# Patient Record
Sex: Female | Born: 1975 | Hispanic: Yes | Marital: Married | Smoking: Never smoker
Health system: Southern US, Community
[De-identification: ages and names within clinical notes are randomized; demographics above are authoritative.]

## PROBLEM LIST (undated history)

## (undated) DIAGNOSIS — Z789 Other specified health status: Secondary | ICD-10-CM

## (undated) HISTORY — DX: Other specified health status: Z78.9

---

## 2005-07-04 ENCOUNTER — Ambulatory Visit: Payer: Self-pay | Admitting: Family Medicine

## 2005-07-06 ENCOUNTER — Observation Stay: Payer: Self-pay | Admitting: Obstetrics and Gynecology

## 2005-07-20 ENCOUNTER — Inpatient Hospital Stay: Payer: Self-pay | Admitting: Obstetrics and Gynecology

## 2010-09-07 ENCOUNTER — Encounter: Payer: Self-pay | Admitting: Obstetrics & Gynecology

## 2016-08-02 ENCOUNTER — Telehealth: Payer: Self-pay | Admitting: Obstetrics & Gynecology

## 2016-08-02 NOTE — Telephone Encounter (Signed)
Pt is being referred by [facility] for Supervision of Elderly multgravida, Second trimeter. I spoke with Minus Breeding at [facility] that at this time we are on temporary Hold on new Medicaid patient and Patient is new to our practice

## 2019-03-02 ENCOUNTER — Ambulatory Visit: Payer: Self-pay | Attending: Internal Medicine

## 2019-03-02 DIAGNOSIS — Z23 Encounter for immunization: Secondary | ICD-10-CM | POA: Insufficient documentation

## 2019-03-02 NOTE — Progress Notes (Signed)
   Covid-19 Vaccination Clinic  Name:  Michelle Camacho    MRN: [id number] DOB: July 30, 1975  03/02/2019  Ms. Michelle Camacho was observed post Covid-19 immunization for 15 minutes without incidence. She was provided with Vaccine Information Sheet and instruction to access the V-Safe system.   Ms. Michelle Camacho was instructed to call 911 with any severe reactions post vaccine: Marland Kitchen Difficulty breathing  . Swelling of your face and throat  . A fast heartbeat  . A bad rash all over your body  . Dizziness and weakness    Immunizations Administered    Name Date Dose VIS Date Route   Moderna COVID-19 Vaccine 03/02/2019  5:15 PM 0.5 mL 12/09/2018 Intramuscular   Manufacturer: Moderna   Lot: [id number]   [id number]

## 2019-03-31 ENCOUNTER — Ambulatory Visit: Payer: Self-pay | Attending: Internal Medicine

## 2019-03-31 DIAGNOSIS — Z23 Encounter for immunization: Secondary | ICD-10-CM

## 2019-03-31 NOTE — Progress Notes (Signed)
   Covid-19 Vaccination Clinic  Name:  Michelle Camacho    MRN: [id number] DOB: September 07, 1975  03/31/2019  Ms. Michelle Camacho was observed post Covid-19 immunization for 15 minutes without incident. She was provided with Vaccine Information Sheet and instruction to access the V-Safe system.   Ms. Michelle Camacho was instructed to call 911 with any severe reactions post vaccine: Marland Kitchen Difficulty breathing  . Swelling of face and throat  . A fast heartbeat  . A bad rash all over body  . Dizziness and weakness   Immunizations Administered    Name Date Dose VIS Date Route   Moderna COVID-19 Vaccine 03/31/2019  4:51 PM 0.5 mL 12/09/2018 Intramuscular   Manufacturer: Moderna   Lot: [id number]   [place]:[id number]

## 2019-10-19 ENCOUNTER — Telehealth: Payer: Self-pay

## 2019-10-19 NOTE — Telephone Encounter (Signed)
Prechart  KP

## 2019-10-20 ENCOUNTER — Encounter: Payer: Self-pay | Admitting: Internal Medicine

## 2019-10-20 ENCOUNTER — Other Ambulatory Visit: Payer: Self-pay

## 2019-10-20 ENCOUNTER — Ambulatory Visit: Payer: No Typology Code available for payment source | Admitting: Internal Medicine

## 2019-10-20 VITALS — BP 122/78 | HR 78 | Temp 98.1°F | Ht 65.0 in | Wt 182.0 lb

## 2019-10-20 DIAGNOSIS — Z975 Presence of (intrauterine) contraceptive device: Secondary | ICD-10-CM

## 2019-10-20 DIAGNOSIS — Z6830 Body mass index (BMI) 30.0-30.9, adult: Secondary | ICD-10-CM

## 2019-10-20 DIAGNOSIS — Z1231 Encounter for screening mammogram for malignant neoplasm of breast: Secondary | ICD-10-CM | POA: Diagnosis not present

## 2019-10-20 DIAGNOSIS — Z23 Encounter for immunization: Secondary | ICD-10-CM | POA: Diagnosis not present

## 2019-10-20 DIAGNOSIS — D485 Neoplasm of uncertain behavior of skin: Secondary | ICD-10-CM

## 2019-10-20 HISTORY — DX: Presence of (intrauterine) contraceptive device: Z97.5

## 2019-10-20 NOTE — Progress Notes (Signed)
Date:  10/20/2019   Name:  Michelle Camacho   DOB:  03/20/1975   MRN:  [id number]  Encounter occurred using video translator.  Chief Complaint: Establish Care, Flu Vaccine, and Nevus (X6 weeks, under left eye, face, has gotten bigger, painful (feels hot), got dark in color part of it has come off. )  Mammogram - none Pap smear - 11/2017  Skin lesion - noticed on face about 6 mo ago.  Part of it came off.  No pain or bleeding, no itching.  No change in vision.  HM - she wants to establish care and get routine screenings.  She has never had a mammogram.  She denies breast issues and denies family hx of breast cancer. She had a Pap in 2019 at [facility].    Mirena IUD was placed last year.  She is not happy with it - she has some pain and very irregular bleeding.  She would like a consultation with GYN to see if she can have it removed and go on OCPs.  No results found for: CREATININE, BUN, NA, K, CL, CO2 No results found for: CHOL, HDL, LDLCALC, LDLDIRECT, TRIG, CHOLHDL No results found for: TSH No results found for: HGBA1C No results found for: WBC, HGB, HCT, MCV, PLT No results found for: ALT, AST, GGT, ALKPHOS, BILITOT   Review of Systems  Constitutional: Negative for chills, fatigue and fever.  HENT: Negative for hearing loss.   Eyes: Negative for visual disturbance.  Respiratory: Negative for cough, chest tightness, shortness of breath and wheezing.   Cardiovascular: Negative for chest pain, palpitations and leg swelling.  Gastrointestinal: Negative for abdominal pain, constipation and diarrhea.  Genitourinary: Positive for menstrual problem (irregular bleeding/ pelvic pain). Negative for dysuria.  Musculoskeletal: Negative for arthralgias and back pain.  Skin: Negative for rash.       Skin lesion under left eye  Neurological: Negative for dizziness and headaches.  Psychiatric/Behavioral: Negative for dysphoric mood and sleep disturbance. The patient is not  nervous/anxious.     Patient Active Problem List   Diagnosis Date Noted  . Neoplasm of uncertain behavior of skin of face 10/20/2019  . BMI 30.0-30.9,adult 10/20/2019  . Encounter for screening mammogram for breast cancer 10/20/2019  . IUD (intrauterine device) in place 10/20/2019    Not on File  Past Surgical History:  Procedure Laterality Date  . CESAREAN SECTION      Social History   Tobacco Use  . Smoking status: Never Smoker  . Smokeless tobacco: Never Used  Vaping Use  . Vaping Use: Never used  Substance Use Topics  . Alcohol use: Yes    Comment: rarely  . Drug use: Never     Medication list has been reviewed and updated.  Current Meds  Medication Sig  . levonorgestrel (MIRENA) 20 MCG/24HR IUD 1 each by Intrauterine route once.    PHQ 2/9 Scores 10/20/2019  PHQ - 2 Score 0  PHQ- 9 Score 0    GAD 7 : Generalized Anxiety Score 10/20/2019  Nervous, Anxious, on Edge 0  Control/stop worrying 0  Worry too much - different things 0  Trouble relaxing 0  Restless 0  Easily annoyed or irritable 0  Afraid - awful might happen 0  Total GAD 7 Score 0  Anxiety Difficulty Not difficult at all    BP Readings from Last 3 Encounters:  10/20/19 122/78    Physical Exam Vitals and nursing note reviewed.  Constitutional:  General: She is not in acute distress.    Appearance: She is well-developed.  HENT:     Head: Normocephalic and atraumatic.  Neck:     Vascular: No carotid bruit.  Cardiovascular:     Rate and Rhythm: Normal rate and regular rhythm.     Pulses: Normal pulses.     Heart sounds: No murmur heard.   Pulmonary:     Effort: Pulmonary effort is normal. No respiratory distress.     Breath sounds: No wheezing or rhonchi.  Musculoskeletal:     Cervical back: Normal range of motion.     Right lower leg: No edema.     Left lower leg: No edema.  Lymphadenopathy:     Cervical: No cervical adenopathy.  Skin:    General: Skin is warm and dry.       Capillary Refill: Capillary refill takes less than 2 seconds.     Findings: No rash.     Comments: 2 mm warty lesion under left eye  Neurological:     General: No focal deficit present.     Mental Status: She is alert and oriented to person, place, and time.  Psychiatric:        Mood and Affect: Mood normal.        Behavior: Behavior normal.     Wt Readings from Last 3 Encounters:  10/20/19 182 lb (82.6 kg)    BP 122/78 (BP Location: Right Arm, Patient Position: Sitting)   Pulse 78   Temp 98.1 F (36.7 C) (Oral)   Ht 5\' 5"  (1.651 m)   Wt 182 lb (82.6 kg)   SpO2 98%   BMI 30.29 kg/m   Assessment and Plan: 1. Neoplasm of uncertain behavior of skin of face Suspect this is a wart and is likely to recur - Ambulatory referral to Dermatology  2. BMI 30.0-30.9,adult Calorie counting sheet given  3. Encounter for screening mammogram for breast cancer Pt to go to [facility] - given number to call - MM 3D [place]; Future  4. IUD (intrauterine device) in place Needs GYN evaluation to discuss removal - Ambulatory referral to Obstetrics / Gynecology  5. Need for immunization against influenza - Flu Vaccine QUAD 36+ mos IM  She will return in several month for CPX, labs, breast exam.  Partially dictated using Editor, commissioning. Any errors are unintentional.  Halina Maidens, MD [facility]  10/20/2019

## 2019-10-20 NOTE — Patient Instructions (Signed)
Recuento de caloras para bajar de peso Calorie Counting for [employer] Loss Las caloras son unidades de Teacher, early years/pre. Su cuerpo necesita una cierta cantidad de caloras de los alimentos para que le ayuden a Company secretary. Cuando come ms caloras de las que el cuerpo necesita, este acumula las caloras extra [place]. Cuando come [facility] de las que el cuerpo [place], este quema grasa para obtener la energa que requiere. El recuento de caloras es el registro de la cantidad de caloras que come y Pharmacologist. El recuento de caloras puede ser de ayuda si necesita perder peso. Si se asegura de comer menos caloras de las que el cuerpo necesita, debe bajar de [place]. Pregntele al mdico cul es un peso sano para usted. Para que el recuento de caloras funcione, tendr que comer la cantidad de caloras adecuadas para usted en un da, para bajar una cantidad de peso saludable por semana. Un nutricionista puede determinar la cantidad de caloras que necesita por da y sugerirle cmo alcanzar su objetivo calrico.  Una cantidad de peso saludable para bajar por semana suele ser [place] 1 y Ivar Drape (0,5 a 0,9kg). Esto significa con frecuencia que su ingesta diaria de caloras se debera reducir unas 500 a 750caloras.  Ingerir 1200 a 1500caloras por Administrator, Civil Service a la [place] a Administrator, Civil Service.  Ingerir de 1500 a 1800caloras por Administrator, Civil Service a la [employer] de los hombres a Administrator, Civil Service. En qu consiste el plan? Mi objetivo es comer __________ Raenette Rover da. Si como esta cantidad de caloras por da, debo bajar unas __________ Terrall Laity. Qu debo saber acerca del recuento de caloras? A fin de alcanzar su objetivo diario de caloras, tendr que:  Averiguar cuntas caloras hay en cada alimento que le Therapist, occupational. Intente hacerlo antes de comer.  Decida la cantidad que puede comer del alimento.  Anote lo que comi y cuntas caloras tena. Esta tarea se conoce como  llevar un registro de comidas. Para perder peso con xito es importante equilibrar el recuento de caloras con un estilo de vida saludable que incluya actividad fsica de forma regular. Tenga un objetivo de 165minutos de ejercicio moderado (como caminar) o 75 minutos de ejercicio vigoroso (como correr) todas las semanas. Dnde encuentro informacin sobre las caloras?  Es posible Animator cantidad de caloras que contiene un alimento en la etiqueta de informacin nutricional. Si un alimento no tiene una etiqueta de informacin nutricional, intente buscar las caloras en Internet o pida ayuda al nutricionista. Recuerde que las caloras se calculan por porcin. Si opta por comer ms de una porcin de un alimento, tendr [employer] las caloras por porcin por la cantidad de porciones que planea comer. Por ejemplo, la etiqueta de un envase de pan puede decir que el tamao de una porcin es [place], y que una porcin tiene 90caloras. Si come 1rodaja, habr comido 90caloras. Si come 2rodajas, habr comido 180caloras. Cmo llevo un registro de comidas? Despus de cada comida, registre la siguiente informacin en el registro de comidas:  Lo que comi. No olvide incluir los aderezos, las salsas y otros extras de la comida.  La cantidad que comi. Esto se puede medir en tazas, onzas o cantidad de alimentos.  Cuntas caloras ingiri por comida y por bebida.  La cantidad total de caloras en la comida. Tenga a Materials engineer de comidas, por ejemplo, en un anotador de bolsillo o utilice una aplicacin mvil o sitio web. Algunos programas  calcularn las caloras y [employer] la cantidad de caloras que le quedan para llegar al objetivo diario. Cules son algunos consejos para el recuento de caloras?   Use las caloras de los alimentos y las bebidas que lo sacien y no lo dejen con apetito: ? Algunos ejemplos de alimentos que lo sacian son los frutos secos y Engineer, mining de frutos  secos, verduras, Advertising account planner y Clinical research associate con alto contenido de Pharmacist, hospital como los cereales integrales. Los alimentos con alto contenido de [place] son aquellos que tienen ms de 5g de fibra por porcin. ? Las [employer] Energy refrescos, especialmente las bebidas a base de caf y los jugos, que contienen muchas caloras, pero no le dan saciedad.  Coma alimentos nutritivos y evite las caloras vacas. Las caloras vacas son aquellas que se obtienen de los alimentos o las bebidas que no contienen muchos nutrientes ni protenas, como los dulces y los refrescos. Es mejor comer una comida nutritiva altamente calrica (como un aguacate) que una con pocos nutrientes (como una bolsa de patatas fritas).  Sepa cuntas caloras tienen los alimentos que come con ms frecuencia. Esto le ayudar a contar las caloras ms rpidamente.  Preste atencin a las [employer] Data. Las bebidas de bajas caloras incluyen agua y refrescos sin Location manager.  Preste atencin a las etiquetas nutricionales de alimentos "bajos en grasas" o "sin grasas". Estos alimentos a veces tienen la misma cantidad de caloras o ms caloras que las versiones ricas en grasa. Con frecuencia, tambin tienen agregados de azcar, almidn o sal, para darles el sabor que fue eliminado con la grasa.  Encuentre un mtodo para controlar las caloras que funcione para usted. Sea creativo. Pruebe aplicaciones o programas distintos, si llevar un registro de las caloras no funciona para usted. Cules son algunos consejos para controlar las porciones?  Sepa cuntas caloras hay en una porcin. Esto lo ayudar a saber cuntas porciones de un alimento determinado puede comer.  Use una taza medidora para medir los tamaos de las porciones. Tambin Secondary school teacher las porciones en una balanza de cocina. Con el tiempo, podr hacer un clculo estimativo de los tamaos de las porciones de algunos alimentos.  Dedique tiempo a poner porciones de  diferentes alimentos en sus platos, tazones y tazas predilectos, a fin de saber cmo se ve una porcin.  Intente no comer directamente de una bolsa o una caja. Esto puede llevarlo a comer en exceso. Ponga la cantidad [employer] gustara comer en una taza o un plato, a fin de asegurarse de que est comiendo la porcin correcta.  Use platos, vasos y tazones ms pequeos para no comer en exceso.  Intente no realizar varias tareas al [employer] (como mirar la TV o usar su computadora) [place] come. Si es la hora de comer, sintese a Conservation officer, nature y disfrute de Environmental education officer. Esto lo ayudar a Marine scientist cundo est satisfecho. Tambin le ayudar a tomar conciencia de lo que est comiendo y de la cantidad. Cules son algunos consejos para seguir este plan? Lectura de las etiquetas de los alimentos  Controle el recuento de caloras en comparacin con el tamao de la porcin. El tamao de la porcin puede ser ms pequeo de lo que suele comer.  Verifique la fuente de las caloras. Asegrese de que la comida que ingiere tenga alto contenido de vitaminas y protenas y sea baja en grasas saturadas y grasas trans. De compras  Lea las etiquetas nutricionales cuando compre. Esto le ayudar a Secondary school teacher  ms saludables antes de comprar una comida.  Haga una lista para el almacn y resptela. La coccin  Intente cocinar sus alimentos preferidos de una manera ms saludable. Por ejemplo, pruebe hornear en vez de frer.  Utilice productos lcteos descremados. Planificacin de los alimentos  Utilice ms frutas y verduras. La mitad de sus platos debe ser de frutas y verduras.  Incluya protenas [employer]Mc[employer]ee y el pescado. Cmo puedo hacer el recuento de caloras cuando como afuera?  Pida porciones ms pequeas.  Considere la posibilidad de Publishing rights manager un plato principal y las guarniciones, en lugar de pedir su propio plato principal.  Si pide su propio plato principal, coma solo la mitad. Pida una caja  al comienzo de la comida y ponga all el resto del plato principal, para no sentir la tentacin de comerlo.  Si se detallan las caloras en el men, elija las opciones que contengan la menor cantidad.  Elija platos que incluyan verduras, frutas, cereales integrales, productos lcteos con bajo contenido de grasa y Advertising account planner.  Opte por los alimentos hervidos, asados, cocidos a la parrilla o al vapor. No coma alimentos que contengan mantequilla, estn empanados, fritos o que se sirvan con salsa a base de crema. Generalmente, los alimentos que se etiquetan como "crujientes" estn fritos, a menos que se indique lo contrario.  Elija el agua, la [place], [place] t helado sin azcar u otras bebidas que no contengan azcares agregados. Si desea una bebida alcohlica, escoja una opcin con menos caloras como una copa de vino o una cerveza ligera.  Ordene los Kimberly[employer]ark, las salsas y los jarabes aparte. Estos son, con frecuencia, de alto contenido en caloras, por lo que debe limitar la cantidad que ingiere.  Si desea Katherine Mantle, elija una de hortalizas y pida carnes a la parrilla. Evite las guarniciones adicionales como el tocino, el queso o los alimentos fritos. Ordene el aderezo aparte o pida aceite de [place] y vinagre o limn para Haematologist.  Haga un clculo estimativo de la cantidad de porciones que le sirven. Por ejemplo, una porcin de arroz cocido equivale a media taza o la mitad del tamao de una pelota de bisbol. Conocer el tamao de las porciones lo ayudar a Personnel officer atento a la cantidad de comida que come [employer]idental Petroleum. La lista que sigue le [place] el tamao de algunas porciones comunes a partir de objetos cotidianos: ? 1onza (28g) = 4dados apilados. ? 3onzas (85g) = 3mazo de cartas. ? 1cucharadita = 1dado. ? 1cucharada = media pelota de tenis de mesa. ? 2cucharadas = 1pelota de tenis de mesa. ? Media taza = media pelota de bisbol. ? 1taza = 1 pelota de  bisbol. Resumen  El recuento de caloras es el registro de la cantidad de caloras que come y Pharmacologist. Si come menos caloras de las que el cuerpo necesita, debe bajar de [place].  Una cantidad de peso saludable para bajar por semana suele ser [place] 1 y Ivar Drape (0,5 a 0,9kg). Esto significa, con frecuencia, reducir su ingesta diaria de caloras unas 500 a 750 caloras.  Es posible Animator cantidad de caloras que contiene un alimento en la etiqueta de informacin nutricional. Si un alimento no tiene una etiqueta de informacin nutricional, intente buscar las caloras en Internet o pida ayuda al nutricionista.  Use las caloras de los alimentos y las bebidas que lo sacien y no de los alimentos y las bebidas que lo dejan con apetito.  Use platos, vasos y  tazones ms pequeos para no comer en exceso. Esta informacin no tiene Marine scientist el consejo del mdico. Asegrese de hacerle al mdico cualquier pregunta que tenga. Document Revised: 03/26/2016 Document Reviewed: 03/26/2016 Elsevier Patient Education  [place].

## 2019-11-10 ENCOUNTER — Ambulatory Visit: Payer: No Typology Code available for payment source | Admitting: Obstetrics and Gynecology

## 2019-11-10 ENCOUNTER — Encounter: Payer: Self-pay | Admitting: Obstetrics and Gynecology

## 2019-11-10 ENCOUNTER — Other Ambulatory Visit: Payer: Self-pay

## 2019-11-10 VITALS — BP 110/70 | Ht 63.0 in | Wt 184.0 lb

## 2019-11-10 DIAGNOSIS — N921 Excessive and frequent menstruation with irregular cycle: Secondary | ICD-10-CM

## 2019-11-10 DIAGNOSIS — N941 Unspecified dyspareunia: Secondary | ICD-10-CM | POA: Diagnosis not present

## 2019-11-10 DIAGNOSIS — K9289 Other specified diseases of the digestive system: Secondary | ICD-10-CM

## 2019-11-10 DIAGNOSIS — Z975 Presence of (intrauterine) contraceptive device: Secondary | ICD-10-CM | POA: Diagnosis not present

## 2019-11-10 NOTE — Progress Notes (Signed)
[name], MD   Chief Complaint  Patient presents with  . Vaginal Bleeding    BTB in between cycles, pain during intercourse x 4 months    HPI:      Ms. Michelle Camacho is a 44 y.o. No obstetric history on file. whose LMP was No LMP recorded. (Menstrual status: IUD)., presents today for NP IUD eval, referred by PCP. (Chart says Mirena IUD but exam c/w Paragard.). Placed about a yr ago. Has monthly menses, lasting 4 days, usually no BTB until past 4 months, minimal dysmen. Pt also with mild dyspareunia past 4 months. Feels like there is air in her stomach that causes pain. Has had increased gas and that gives sx relief. Pt has BM 1-2 times daily, normal for her. No urin sx except occas urgency. No vag sx except occas itching. Uses dove sens skin soap, no dryer sheets. In damp underwear regularly.  Last pap 3 yrs ago, no hx of abn paps.   NEEDS SPANISH INTERPRETER  History reviewed. No pertinent past medical history.  Past Surgical History:  Procedure Laterality Date  . CESAREAN SECTION      History reviewed. No pertinent family history.  Social History   Socioeconomic History  . Marital status: Married    Spouse name: Not on file  . Number of children: 6  . Years of education: Not on file  . Highest education level: Not on file  Occupational History  . Not on file  Tobacco Use  . Smoking status: Never Smoker  . Smokeless tobacco: Never Used  Vaping Use  . Vaping Use: Never used  Substance and Sexual Activity  . Alcohol use: Yes    Comment: rarely  . Drug use: Never  . Sexual activity: Yes    Birth control/protection: I.U.D.    Comment: Mirena  Other Topics Concern  . Not on file  Social History Narrative  . Not on file   Social Determinants of Health   Financial Resource Strain:   . Difficulty of Paying Living Expenses: Not on file  Food Insecurity:   . Worried About Charity fundraiser in the Last Year: Not on file  . Ran Out of Food in  the Last Year: Not on file  Transportation Needs:   . Lack of Transportation (Medical): Not on file  . Lack of Transportation (Non-Medical): Not on file  Physical Activity:   . Days of Exercise per Week: Not on file  . Minutes of Exercise per Session: Not on file  Stress:   . Feeling of Stress : Not on file  Social Connections:   . Frequency of Communication with Friends and Family: Not on file  . Frequency of Social Gatherings with Friends and Family: Not on file  . Attends Religious Services: Not on file  . Active Member of Clubs or Organizations: Not on file  . Attends Archivist Meetings: Not on file  . Marital Status: Not on file  Intimate Partner Violence:   . Fear of Current or Ex-Partner: Not on file  . Emotionally Abused: Not on file  . Physically Abused: Not on file  . Sexually Abused: Not on file    Outpatient Medications Prior to Visit  Medication Sig Dispense Refill  . PARAGARD INTRAUTERINE COPPER IU by Intrauterine route.    Marland Kitchen levonorgestrel (MIRENA) 20 MCG/24HR IUD 1 each by Intrauterine route once.     No facility-administered medications prior to visit.  ROS:  Review of Systems  Constitutional: Negative for fever.  Gastrointestinal: Positive for abdominal pain. Negative for blood in stool, constipation, diarrhea, nausea and vomiting.  Genitourinary: Positive for dyspareunia. Negative for dysuria, flank pain, frequency, hematuria, urgency, vaginal bleeding, vaginal discharge and vaginal pain.  Musculoskeletal: Negative for back pain.  Skin: Negative for rash.    OBJECTIVE:   Vitals:  BP 110/70   Ht 5\' 3"  (1.6 m)   Wt 184 lb (83.5 kg)   BMI 32.59 kg/m   Physical Exam Vitals reviewed.  Constitutional:      Appearance: She is well-developed.  Pulmonary:     Effort: Pulmonary effort is normal.  Abdominal:     Palpations: Abdomen is soft.     Tenderness: There is no abdominal tenderness. There is no guarding or rebound.    Genitourinary:    General: Normal vulva.     Pubic Area: No rash.      Labia:        Right: No rash, tenderness or lesion.        Left: No rash, tenderness or lesion.      Vagina: Normal. No vaginal discharge, erythema or tenderness.     Cervix: Normal.     Uterus: Normal. Not enlarged and not tender.      Adnexa: Right adnexa normal and left adnexa normal.       Right: No mass or tenderness.         Left: No mass or tenderness.       Comments: WHITE IUD STRINGS IN CX OS Musculoskeletal:        General: Normal range of motion.     Cervical back: Normal range of motion.  Skin:    General: Skin is warm and dry.  Neurological:     General: No focal deficit present.     Mental Status: She is alert and oriented to person, place, and time.  Psychiatric:        Mood and Affect: Mood normal.        Behavior: Behavior normal.        Thought Content: Thought content normal.        Judgment: Judgment normal.     Assessment/Plan: Breakthrough bleeding with IUD - Plan: US PELVIS TRANSVAGINAL NON-OB (TV ONLY); IUD strings in cx os. Check GYN u/s. If in correct location, reassurance. If malpositioned, will remove and f/u.   Dyspareunia in female--with increased gas/bloating. Check GYN u/s. If WNL, sx most likely related to GI etiology. Will f/u with results.   Gas bloat syndrome    Return in about 1 day (around 11/11/2019), or if symptoms worsen or fail to improve, for GYN u/s for BTB with IUD--[facility] to call pt.  Francisca Harbuck B. Earlin Sweeden, PA-C 11/10/2019 4:50 PM

## 2019-11-10 NOTE — Patient Instructions (Signed)
I value your feedback and entrusting us with your care. If you get a [facility] patient survey, I would appreciate you taking the time to let us know about your experience today. Thank you!  As of December 18, 2018, your lab results will be released to your MyChart immediately, before I even have a chance to see them. Please give me time to review them and contact you if there are any abnormalities. Thank you for your patience.  

## 2019-11-19 ENCOUNTER — Ambulatory Visit: Payer: No Typology Code available for payment source

## 2019-11-19 DIAGNOSIS — N921 Excessive and frequent menstruation with irregular cycle: Secondary | ICD-10-CM

## 2019-11-24 ENCOUNTER — Other Ambulatory Visit: Payer: Self-pay

## 2019-11-24 ENCOUNTER — Ambulatory Visit: Payer: No Typology Code available for payment source

## 2019-11-24 ENCOUNTER — Ambulatory Visit (INDEPENDENT_AMBULATORY_CARE_PROVIDER_SITE_OTHER): Payer: No Typology Code available for payment source

## 2019-11-24 DIAGNOSIS — Z975 Presence of (intrauterine) contraceptive device: Secondary | ICD-10-CM

## 2019-11-24 DIAGNOSIS — N921 Excessive and frequent menstruation with irregular cycle: Secondary | ICD-10-CM | POA: Diagnosis not present

## 2019-11-25 ENCOUNTER — Telehealth: Payer: Self-pay | Admitting: Obstetrics and Gynecology

## 2019-11-25 NOTE — Telephone Encounter (Signed)
CMA Drenda Freeze called pt per my direction (speaks Spanish) re: neg GYN u/s results. IUD in correct location. Dyspareunia/pelvic bloating most likely GI related since u/s WNL. Pt's questions answered. F/u prn.

## 2020-03-09 ENCOUNTER — Ambulatory Visit: Payer: Self-pay | Admitting: Dermatology

## 2020-03-10 ENCOUNTER — Encounter: Payer: No Typology Code available for payment source | Admitting: Internal Medicine

## 2020-04-29 NOTE — Progress Notes (Signed)
Spoke to pt reminded her to call and schedule a mammogram. Pt verbalized understanding.  KP

## 2020-07-01 ENCOUNTER — Other Ambulatory Visit: Payer: Self-pay

## 2020-07-01 ENCOUNTER — Encounter: Payer: 59 | Admitting: Internal Medicine

## 2020-07-01 NOTE — Progress Notes (Deleted)
Date:  07/01/2020   Name:  Michelle Camacho   DOB:  Apr 20, 1975   MRN:  [id number]   Chief Complaint: No chief complaint on file. Michelle Camacho is a 45 y.o. female who presents today for her Complete Annual Exam. She feels {DESC; WELL/FAIRLY WELL/POORLY:18703}. She reports exercising ***. She reports she is sleeping {DESC; WELL/FAIRLY WELL/POORLY:18703}. Breast complaints ***.  Mammogram: none - due DEXA: not due Pap smear: unknown Colonoscopy: not due  Immunization History  Administered Date(s) Administered   Influenza,inj,Quad PF,6+ Mos 10/20/2019   Influenza-Unspecified 10/19/2016   Moderna Sars-Covid-2 Vaccination 03/02/2019, 03/31/2019   Tdap 10/19/2016    HPI  No results found for: CREATININE, BUN, NA, K, CL, CO2 No results found for: CHOL, HDL, LDLCALC, LDLDIRECT, TRIG, CHOLHDL No results found for: TSH No results found for: HGBA1C No results found for: WBC, HGB, HCT, MCV, PLT No results found for: ALT, AST, GGT, ALKPHOS, BILITOT   Review of Systems  Constitutional:  Negative for chills, fatigue and fever.  HENT:  Negative for congestion, hearing loss, tinnitus, trouble swallowing and voice change.   Eyes:  Negative for visual disturbance.  Respiratory:  Negative for cough, chest tightness, shortness of breath and wheezing.   Cardiovascular:  Negative for chest pain, palpitations and leg swelling.  Gastrointestinal:  Negative for abdominal pain, constipation, diarrhea and vomiting.  Endocrine: Negative for polydipsia and polyuria.  Genitourinary:  Negative for dysuria, frequency, genital sores, vaginal bleeding and vaginal discharge.  Musculoskeletal:  Negative for arthralgias, gait problem and joint swelling.  Skin:  Negative for color change and rash.  Neurological:  Negative for dizziness, tremors, light-headedness and headaches.  Hematological:  Negative for adenopathy. Does not bruise/bleed easily.  Psychiatric/Behavioral:  Negative for  dysphoric mood and sleep disturbance. The patient is not nervous/anxious.    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  No outpatient medications have been marked as taking for the 07/01/20 encounter (Appointment) with [name], MD.    [facility] 2/9 Scores 10/20/2019  PHQ - 2 Score 0  PHQ- 9 Score 0    GAD 7 : Generalized Anxiety Score 10/20/2019  Nervous, Anxious, on Edge 0  Control/stop worrying 0  Worry too much - different things 0  Trouble relaxing 0  Restless 0  Easily annoyed or irritable 0  Afraid - awful might happen 0  Total GAD 7 Score 0  Anxiety Difficulty Not difficult at all    BP Readings from Last 3 Encounters:  11/10/19 110/70  10/20/19 122/78    Physical Exam Vitals and nursing note reviewed.  Constitutional:      General: She is not in acute distress.    Appearance: She is well-developed.  HENT:     Head: Normocephalic and atraumatic.     Right Ear: Tympanic membrane and  ear canal normal.     Left Ear: Tympanic membrane and ear canal normal.     Nose:     Right Sinus: No maxillary sinus tenderness.     Left Sinus: No maxillary sinus tenderness.  Eyes:     General: No scleral icterus.       Right eye: No discharge.        Left eye: No discharge.     Conjunctiva/sclera: Conjunctivae normal.  Neck:     Thyroid: No thyromegaly.     Vascular: No carotid bruit.  Cardiovascular:      Rate and Rhythm: Normal rate and regular rhythm.     Pulses: Normal pulses.     Heart sounds: Normal heart sounds.  Pulmonary:     Effort: Pulmonary effort is normal. No respiratory distress.     Breath sounds: No wheezing.  Chest:  Breasts:    Right: No mass, nipple discharge, skin change or tenderness.     Left: No mass, nipple discharge, skin change or tenderness.  Abdominal:     General: Bowel sounds are normal.     Palpations: Abdomen is soft.     Tenderness: There is no abdominal tenderness.  Musculoskeletal:     Cervical back: Normal range of motion. No erythema.     Right lower leg: No edema.     Left lower leg: No edema.  Lymphadenopathy:     Cervical: No cervical adenopathy.  Skin:    General: Skin is warm and dry.     Findings: No rash.  Neurological:     Mental Status: She is alert and oriented to person, place, and time.     Cranial Nerves: No cranial nerve deficit.     Sensory: No sensory deficit.     Deep Tendon Reflexes: Reflexes are normal and symmetric.  Psychiatric:        Attention and Perception: Attention normal.        Mood and Affect: Mood normal.    Wt Readings from Last 3 Encounters:  11/10/19 184 lb (83.5 kg)  10/20/19 182 lb (82.6 kg)    There were no vitals taken for this visit.  Assessment and Plan:

## 2020-07-08 ENCOUNTER — Other Ambulatory Visit (HOSPITAL_COMMUNITY)
Admission: RE | Admit: 2020-07-08 | Discharge: 2020-07-08 | Disposition: A | Discharge status: Home / Self Care | Payer: No Typology Code available for payment source | Source: Ambulatory Visit | Attending: Internal Medicine | Admitting: Internal Medicine

## 2020-07-08 ENCOUNTER — Ambulatory Visit (INDEPENDENT_AMBULATORY_CARE_PROVIDER_SITE_OTHER): Payer: 59 | Admitting: Internal Medicine

## 2020-07-08 ENCOUNTER — Encounter: Payer: Self-pay | Admitting: Internal Medicine

## 2020-07-08 ENCOUNTER — Other Ambulatory Visit: Payer: Self-pay

## 2020-07-08 ENCOUNTER — Telehealth: Payer: Self-pay

## 2020-07-08 VITALS — BP 127/74 | HR 74 | Temp 98.1°F | Ht 63.0 in | Wt 174.0 lb

## 2020-07-08 DIAGNOSIS — Z975 Presence of (intrauterine) contraceptive device: Secondary | ICD-10-CM

## 2020-07-08 DIAGNOSIS — Z124 Encounter for screening for malignant neoplasm of cervix: Secondary | ICD-10-CM

## 2020-07-08 DIAGNOSIS — Z1231 Encounter for screening mammogram for malignant neoplasm of breast: Secondary | ICD-10-CM | POA: Diagnosis not present

## 2020-07-08 DIAGNOSIS — Z1159 Encounter for screening for other viral diseases: Secondary | ICD-10-CM

## 2020-07-08 DIAGNOSIS — Z0000 Encounter for general adult medical examination without abnormal findings: Secondary | ICD-10-CM

## 2020-07-08 NOTE — Progress Notes (Signed)
Date:  07/08/2020   Name:  Michelle Camacho   DOB:  01-19-1975   MRN:  [id number]   Chief Complaint: Annual Exam (Pap smear. Declined breast exam- no breast complaints. Used interpretor machine with Status Video - Ivon [phone]. ) Michelle Camacho is a 45 y.o. female who presents today for her Complete Annual Exam. She feels well. She reports exercising - none. She reports she is sleeping well. Breast complaints - none.  Mammogram: due Pap smear: due Colonoscopy: not due  Immunization History  Administered Date(s) Administered   Influenza,inj,Quad PF,6+ Mos 10/20/2019   Influenza-Unspecified 10/19/2016   Moderna Sars-Covid-2 Vaccination 03/02/2019, 03/31/2019, 01/15/2020   Tdap 10/19/2016    HPI  No results found for: CREATININE, BUN, NA, K, CL, CO2 No results found for: CHOL, HDL, LDLCALC, LDLDIRECT, TRIG, CHOLHDL No results found for: TSH No results found for: HGBA1C No results found for: WBC, HGB, HCT, MCV, PLT No results found for: ALT, AST, GGT, ALKPHOS, BILITOT   Review of Systems  Constitutional:  Negative for chills, fatigue and unexpected weight change.  HENT:  Negative for hearing loss and trouble swallowing.   Eyes:  Negative for visual disturbance.  Respiratory:  Negative for chest tightness and shortness of breath.   Cardiovascular:  Negative for chest pain and leg swelling.  Gastrointestinal:  Negative for abdominal pain, constipation and diarrhea.  Genitourinary:  Positive for menstrual problem (irregular bleeding and pelvic cramping intermittently relieved by Advil). Negative for dysuria.  Musculoskeletal:  Negative for arthralgias.  Neurological:  Negative for dizziness and headaches.  Psychiatric/Behavioral:  Negative for dysphoric mood and sleep disturbance. The patient is not nervous/anxious.    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  Current Meds  Medication Sig   PARAGARD INTRAUTERINE COPPER IU by Intrauterine route.    PHQ 2/9 Scores 07/08/2020 10/20/2019  PHQ - 2 Score 0 0  PHQ- 9 Score 0 0    GAD 7 : Generalized Anxiety Score 07/08/2020 10/20/2019  Nervous, Anxious, on Edge 0 0  Control/stop worrying 0 0  Worry too much - different things 0 0  Trouble relaxing 0 0  Restless 0 0  Easily annoyed or irritable 0 0  Afraid - awful might happen 0 0  Total GAD 7 Score 0 0  Anxiety Difficulty Not difficult at all Not difficult at all    BP Readings from Last 3 Encounters:  07/08/20 127/74  11/10/19 110/70  10/20/19 122/78    Physical Exam Vitals and nursing note reviewed.  Constitutional:      General: She is not in acute distress.    Appearance: She is well-developed.  HENT:     Head: Normocephalic and atraumatic.     Right Ear: Tympanic membrane and ear canal normal.     Left Ear: Tympanic  membrane and ear canal normal.     Nose:     Right Sinus: No maxillary sinus tenderness.     Left Sinus: No maxillary sinus tenderness.  Eyes:     General: No scleral icterus.       Right eye: No discharge.        Left eye: No discharge.     Conjunctiva/sclera: Conjunctivae normal.  Neck:     Thyroid: No thyromegaly.     Vascular: No carotid bruit.  Cardiovascular:     Rate and Rhythm: Normal rate and regular rhythm.     Pulses: Normal pulses.     Heart sounds: Normal heart sounds.  Pulmonary:     Effort: Pulmonary effort is normal. No  respiratory distress.     Breath sounds: No wheezing.  Chest:  Breasts:    Right: No mass, nipple discharge, skin change or tenderness.     Left: No mass, nipple discharge, skin change or tenderness.  Abdominal:     General: Bowel sounds are normal.     Palpations: Abdomen is soft.     Tenderness: There is no abdominal tenderness.  Genitourinary:    Labia:        Right: No tenderness, lesion or injury.        Left: No tenderness, lesion or injury.      Vagina: Normal.     Cervix: Friability present.     Uterus: Normal.      Adnexa:        Right: Tenderness present. No fullness.         Left: Tenderness present. No fullness.       Comments: IUD filament present at OS Mild bilateral pelvic discomfort to palpation - no mass appreciated Pap obtained Musculoskeletal:     Cervical back: Normal range of motion. No erythema.     Right lower leg: No edema.     Left lower leg: No edema.  Lymphadenopathy:     Cervical: No cervical adenopathy.  Skin:    General: Skin is warm and dry.     Findings: No rash.  Neurological:     Mental Status: She is alert and oriented to person, place, and time.     Cranial Nerves: No cranial nerve deficit.     Sensory: No sensory deficit.     Deep Tendon Reflexes: Reflexes are normal and symmetric.  Psychiatric:        Attention and Perception: Attention normal.        Mood and Affect: Mood normal.    Wt Readings from Last 3 Encounters:  07/08/20 174 lb (78.9 kg)  11/10/19 184 lb (83.5 kg)  10/20/19 182 lb (82.6 kg)    BP 127/74 (BP Location: Right Arm, Patient Position: Sitting, Cuff Size: Normal)   Pulse 74   Temp 98.1 F (36.7 C) (Oral)   Ht 5\' 3"  (1.6 m)   Wt 174 lb (78.9 kg)   LMP 07/04/2020 (Exact Date)   SpO2 97%   BMI 30.82 kg/m   Assessment and Plan: 1. Annual physical exam Normal exam except for weight. Recommend regular exercise/healthy diet - CBC with Differential/Platelet - Comprehensive metabolic panel - Lipid panel -  TSH - HIV Antibody (routine testing w rflx)  2. Encounter for screening mammogram for breast cancer Schedule at [facility] - MM 3D SCREEN BREAST BILATERAL; Future  3. Encounter for screening for cervical cancer Pap obtained with GC/chlamydia and HPV - Cytology - PAP  4. Need for hepatitis  C screening test - Hepatitis C antibody  5. IUD (intrauterine device) in place Present for 2 years Mild persistent/intermittent pelvic discomfort Continue Advil as needed Recommend GYN follow up if needed   Partially dictated using Editor, commissioning. Any errors are unintentional.  Halina Maidens, MD [facility]  07/08/2020

## 2020-07-08 NOTE — Telephone Encounter (Signed)
Pt would like to know if there is anything she can take or do to help with cramps in her lags (Charley horse), please advise.

## 2020-07-08 NOTE — Telephone Encounter (Signed)
Spoke to pt and gave info

## 2020-07-09 LAB — LIPID PANEL
Chol/HDL Ratio: 5.1 ratio — ABNORMAL HIGH (ref 0.0–4.4)
Cholesterol, Total: 195 mg/dL (ref 100–199)
HDL: 38 mg/dL — ABNORMAL LOW (ref >39)
LDL Chol Calc (NIH): 129 mg/dL — ABNORMAL HIGH (ref 0–99)
Triglycerides: 154 mg/dL — ABNORMAL HIGH (ref 0–149)
VLDL Cholesterol Cal: 28 mg/dL (ref 5–40)

## 2020-07-09 LAB — COMPREHENSIVE METABOLIC PANEL
ALT: 58 IU/L — ABNORMAL HIGH (ref 0–32)
AST: 42 IU/L — ABNORMAL HIGH (ref 0–40)
Albumin/Globulin Ratio: 1.5 (ref 1.2–2.2)
Albumin: 4.1 g/dL (ref 3.8–4.8)
Alkaline Phosphatase: 48 IU/L (ref 44–121)
BUN/Creatinine Ratio: 18 (ref 9–23)
BUN: 12 mg/dL (ref 6–24)
Bilirubin Total: 0.3 mg/dL (ref 0.0–1.2)
CO2: 22 mmol/L (ref 20–29)
Calcium: 9.2 mg/dL (ref 8.7–10.2)
Chloride: 103 mmol/L (ref 96–106)
Creatinine, Ser: 0.66 mg/dL (ref 0.57–1.00)
Globulin, Total: 2.8 g/dL (ref 1.5–4.5)
Glucose: 93 mg/dL (ref 65–99)
Potassium: 4.3 mmol/L (ref 3.5–5.2)
Sodium: 136 mmol/L (ref 134–144)
Total Protein: 6.9 g/dL (ref 6.0–8.5)
eGFR: 112 mL/min/{1.73_m2} (ref >59)

## 2020-07-09 LAB — CBC WITH DIFFERENTIAL/PLATELET
Basophils Absolute: 0 10*3/uL (ref 0.0–0.2)
Basos: 1 %
EOS (ABSOLUTE): 0.3 10*3/uL (ref 0.0–0.4)
Eos: 7 %
Hematocrit: 37.3 % (ref 34.0–46.6)
Hemoglobin: 12.2 g/dL (ref 11.1–15.9)
Immature Grans (Abs): 0 10*3/uL (ref 0.0–0.1)
Immature Granulocytes: 0 %
Lymphocytes Absolute: 2.3 10*3/uL (ref 0.7–3.1)
Lymphs: 44 %
MCH: 28.5 pg (ref 26.6–33.0)
MCHC: 32.7 g/dL (ref 31.5–35.7)
MCV: 87 fL (ref 79–97)
Monocytes Absolute: 0.3 10*3/uL (ref 0.1–0.9)
Monocytes: 6 %
Neutrophils Absolute: 2.1 10*3/uL (ref 1.4–7.0)
Neutrophils: 42 %
Platelets: 278 10*3/uL (ref 150–450)
RBC: 4.28 x10E6/uL (ref 3.77–5.28)
RDW: 13.4 % (ref 11.7–15.4)
WBC: 5.1 10*3/uL (ref 3.4–10.8)

## 2020-07-09 LAB — HIV ANTIBODY (ROUTINE TESTING W REFLEX): HIV Screen 4th Generation wRfx: NONREACTIVE

## 2020-07-09 LAB — TSH: TSH: 4.33 u[IU]/mL (ref 0.450–4.500)

## 2020-07-09 LAB — HEPATITIS C ANTIBODY: Hep C Virus Ab: 0.2 s/co ratio (ref 0.0–0.9)

## 2020-07-12 LAB — CYTOLOGY - PAP
Chlamydia: NEGATIVE
Comment: NEGATIVE
Comment: NEGATIVE
Comment: NORMAL
Diagnosis: NEGATIVE
High risk HPV: NEGATIVE
Neisseria Gonorrhea: NEGATIVE

## 2020-07-14 ENCOUNTER — Other Ambulatory Visit: Payer: Self-pay

## 2020-07-14 ENCOUNTER — Ambulatory Visit
Admission: RE | Admit: 2020-07-14 | Discharge: 2020-07-14 | Disposition: A | Discharge status: Home / Self Care | Payer: 59 | Source: Ambulatory Visit | Attending: Internal Medicine | Admitting: Internal Medicine

## 2020-07-14 DIAGNOSIS — Z1231 Encounter for screening mammogram for malignant neoplasm of breast: Secondary | ICD-10-CM

## 2020-07-19 ENCOUNTER — Other Ambulatory Visit: Payer: Self-pay | Admitting: Internal Medicine

## 2020-07-19 DIAGNOSIS — R928 Other abnormal and inconclusive findings on diagnostic imaging of breast: Secondary | ICD-10-CM

## 2020-07-19 DIAGNOSIS — N6310 Unspecified lump in the right breast, unspecified quadrant: Secondary | ICD-10-CM

## 2020-07-26 ENCOUNTER — Ambulatory Visit
Admission: RE | Admit: 2020-07-26 | Discharge: 2020-07-26 | Disposition: A | Discharge status: Home / Self Care | Payer: 59 | Source: Ambulatory Visit | Attending: Internal Medicine | Admitting: Internal Medicine

## 2020-07-26 ENCOUNTER — Other Ambulatory Visit: Payer: Self-pay

## 2020-07-26 DIAGNOSIS — N6310 Unspecified lump in the right breast, unspecified quadrant: Secondary | ICD-10-CM | POA: Diagnosis present

## 2020-07-26 DIAGNOSIS — R928 Other abnormal and inconclusive findings on diagnostic imaging of breast: Secondary | ICD-10-CM

## 2020-07-27 ENCOUNTER — Other Ambulatory Visit: Payer: Self-pay | Admitting: Internal Medicine

## 2020-08-17 ENCOUNTER — Encounter: Payer: Self-pay | Admitting: Family Medicine

## 2020-08-17 ENCOUNTER — Ambulatory Visit (INDEPENDENT_AMBULATORY_CARE_PROVIDER_SITE_OTHER): Payer: 59 | Admitting: Family Medicine

## 2020-08-17 ENCOUNTER — Other Ambulatory Visit: Payer: Self-pay

## 2020-08-17 VITALS — BP 116/74 | HR 94 | Temp 98.4°F | Ht 63.0 in | Wt 184.0 lb

## 2020-08-17 DIAGNOSIS — L509 Urticaria, unspecified: Secondary | ICD-10-CM | POA: Insufficient documentation

## 2020-08-17 MED ORDER — PREDNISONE 10 MG (48) PO TBPK: ORAL_TABLET | Freq: Every day | ORAL | 0 refills | Status: DC

## 2020-08-17 NOTE — Assessment & Plan Note (Addendum)
Patient with 2-3 day history of progressive whole body, intensely pruritic rash that has now involved the right neck, right lateral torso, left axilla, periumbilical bilaterally, and inguinal regions. She denies any shortness of air, no cough, no throat tightness, no similar episodes in the past. In regards to new exposures, she states that she was given an herbal hair oil as a gift which she used on Sunday, additionally she ingested shrimp (no issues in past), and she was outdoors Sunday for a cookout.   Her examination reveals urticarial rash on an erythematous base at the above listed locations, no scalp involvement, and cardiopulmonary and oropharynx findings are benign. Concern is for urticarial allergic systemic reaction to unknown exposure with possible food or hair oil being likely agents. I have cautioned patient on possible later life food allergy development. I have advised cetirizine and diphenhydramine scheduled and low-threshold to initiate prednisone which was prescribed as well. She is to seek emergent medical attention should any respiratory symptoms occur.

## 2020-08-17 NOTE — Patient Instructions (Signed)
-   Start cetirizine (Zyrtec) every a.m. - Start diphenhydramine (Benadryl) - Start prednisone tomorrow if symptoms fail to improve - If any breathing difficulty or worsened symptoms occur, go to ER - If symptoms fail to improve after steroids, contact our office

## 2020-08-17 NOTE — Progress Notes (Signed)
Primary Care / Sports Medicine Office Visit  Patient Information:  Patient ID: Michelle Camacho, female DOB: Jun 08, 1975 Age: 45 y.o. MRN: [id number]   Michelle Camacho is a pleasant 45 y.o. 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 No pertinent past medical history. Outpatient Encounter Medications as of 08/17/2020  Medication Sig   PARAGARD INTRAUTERINE COPPER IU 1 each by Intrauterine route once.   predniSONE (STERAPRED UNI-PAK 48 TAB) 10 MG (48) TBPK tablet Take by mouth daily. 12-day taper pack, use as directed for taper   No facility-administered encounter medications on file as of 08/17/2020.   Past Surgical History:  Procedure Laterality Date   CESAREAN SECTION      Vitals:   08/17/20 1122  BP: 116/74  Pulse: 94  Temp: 98.4 F (36.9 C)  SpO2: 97%   Vitals:   08/17/20 1122  Weight: 184 lb (83.5 kg)  Height: '5\' 3"'$  (1.6 m)   Body mass index is 32.59 kg/m.     Independent interpretation of notes and tests performed by another provider:   None  Procedures performed:   None  Pertinent History, Exam, Impression, and Recommendations:   Urticaria of entire body Patient with 2-3 day history of progressive whole body, intensely pruritic rash that has now involved  the right neck, right lateral torso, left axilla, periumbilical bilaterally, and inguinal regions. She denies any shortness of air, no cough, no throat tightness, no similar episodes in the past. In regards to new exposures, she states that she was given an herbal hair oil as a gift which she used on Sunday, additionally she ingested shrimp (no issues in past), and she was outdoors Sunday for a cookout.   Her examination reveals urticarial rash on an erythematous base at the above listed locations, no scalp involvement, and cardiopulmonary and oropharynx findings are benign. Concern is for urticarial allergic systemic reaction to unknown exposure with possible food or hair oil being likely agents. I have cautioned patient on possible later life food allergy development. I have advised cetirizine and diphenhydramine scheduled and low-threshold to initiate prednisone which was prescribed as well. She is to seek emergent medical attention should any respiratory symptoms occur.   Female chaperone: BN present throughout physical examination  Orders & Medications Meds ordered this encounter  Medications   predniSONE (STERAPRED UNI-PAK 48 TAB) 10 MG (48) TBPK tablet    Sig: Take by mouth daily. 12-day taper pack, use as directed for taper    Dispense:  1 tablet    Refill:  0   No orders of the defined types were placed in this encounter.    Return if symptoms worsen or fail to improve.     Montel Culver, MD   Primary Care Sports Medicine [place]

## 2021-06-30 ENCOUNTER — Ambulatory Visit (INDEPENDENT_AMBULATORY_CARE_PROVIDER_SITE_OTHER): Payer: 59 | Admitting: Internal Medicine

## 2021-06-30 ENCOUNTER — Encounter: Payer: Self-pay | Admitting: Internal Medicine

## 2021-06-30 VITALS — BP 124/80 | HR 86 | Ht 63.0 in | Wt 192.0 lb

## 2021-06-30 DIAGNOSIS — E782 Mixed hyperlipidemia: Secondary | ICD-10-CM | POA: Diagnosis not present

## 2021-06-30 DIAGNOSIS — R252 Cramp and spasm: Secondary | ICD-10-CM

## 2021-06-30 DIAGNOSIS — R928 Other abnormal and inconclusive findings on diagnostic imaging of breast: Secondary | ICD-10-CM | POA: Diagnosis not present

## 2021-06-30 DIAGNOSIS — N926 Irregular menstruation, unspecified: Secondary | ICD-10-CM | POA: Diagnosis not present

## 2021-06-30 DIAGNOSIS — Z0000 Encounter for general adult medical examination without abnormal findings: Secondary | ICD-10-CM | POA: Diagnosis not present

## 2021-06-30 DIAGNOSIS — R7989 Other specified abnormal findings of blood chemistry: Secondary | ICD-10-CM

## 2021-06-30 NOTE — Progress Notes (Deleted)
Date:  06/30/2021   Name:  Michelle Camacho   DOB:  [birth year]   MRN:  [id number]   Chief Complaint: Annual Exam  HPI  Kaylenn Pittinger is a 46 y.o. female who presents today for her Complete Annual Exam. She feels fairly well. She reports exercising none. She reports she is sleeping fairly well. Breast complaints none.    Health Maintenance Due  Topic Date Due   COVID-19 Vaccine (4 - Booster for Moderna series) 03/11/2020    Immunization History  Administered Date(s) Administered   Influenza,inj,Quad PF,6+ Mos 10/20/2019   Influenza-Unspecified 10/19/2016   Moderna Sars-Covid-2 Vaccination 03/02/2019, 03/31/2019, 01/15/2020   Tdap 10/19/2016     Lab Results  Component Value Date   NA 136 07/08/2020   K 4.3 07/08/2020   CO2 22 07/08/2020   GLUCOSE 93 07/08/2020   BUN 12 07/08/2020   CREATININE 0.66 07/08/2020   CALCIUM 9.2 07/08/2020   EGFR 112 07/08/2020   Lab Results  Component Value Date   CHOL 195 07/08/2020   HDL 38 (L) 07/08/2020   LDLCALC 129 (H) 07/08/2020   TRIG 154 (H) 07/08/2020   CHOLHDL 5.1 (H) 07/08/2020   Lab Results  Component Value Date   TSH 4.330 07/08/2020   No results found for: "HGBA1C" Lab Results  Component Value Date   WBC 5.1 07/08/2020   HGB 12.2 07/08/2020   HCT 37.3 07/08/2020   MCV 87 07/08/2020   PLT 278 07/08/2020   Lab Results  Component Value Date   ALT 58 (H) 07/08/2020   AST 42 (H) 07/08/2020   ALKPHOS 48 07/08/2020   BILITOT 0.3 07/08/2020   No results found for: "25OHVITD2", "25OHVITD3", "VD25OH"   Review of Systems  Patient Active Problem List   Diagnosis Date Noted   Urticaria of entire body 08/17/2020   Neoplasm of uncertain behavior of skin of face 10/20/2019   BMI 30.0-30.9,adult 10/20/2019   Encounter for screening mammogram for breast cancer 10/20/2019   IUD (intrauterine device) in place 10/20/2019    No Known Allergies  Past Surgical History:  Procedure Laterality Date    CESAREAN SECTION      Social History   Tobacco Use   Smoking status: Never   Smokeless tobacco: Never  Vaping Use   Vaping Use: Never used  Substance Use Topics   Alcohol use: Not Currently   Drug use: Never     Medication list has been reviewed and updated.  No outpatient medications have been marked as taking for the 06/30/21 encounter (Office Visit) with Reubin Milan, MD.       08/17/2020   11:48 AM 07/08/2020    8:44 AM 10/20/2019    3:09 PM  GAD 7 : Generalized Anxiety Score  Nervous, Anxious, on Edge 0 0 0  Control/stop worrying 0 0 0  Worry too much - different things 0 0 0  Trouble relaxing 0 0 0  Restless 0 0 0  Easily annoyed or irritable 0 0 0  Afraid - awful might happen 0 0 0  Total GAD 7 Score 0 0 0  Anxiety Difficulty Not difficult at all Not difficult at all Not difficult at all       08/17/2020   11:48 AM  Depression screen PHQ 2/9  Decreased Interest 0  Down, Depressed, Hopeless 0  PHQ - 2 Score 0  Altered sleeping 0  Tired, decreased energy 0  Change in appetite 0  Feeling bad  or failure about yourself  0  Trouble concentrating 0  Moving slowly or fidgety/restless 0  Suicidal thoughts 0  PHQ-9 Score 0  Difficult doing work/chores Not difficult at all    BP Readings from Last 3 Encounters:  08/17/20 116/74  07/08/20 127/74  11/10/19 110/70    Physical Exam  Wt Readings from Last 3 Encounters:  08/17/20 184 lb (83.5 kg)  07/08/20 174 lb (78.9 kg)  11/10/19 184 lb (83.5 kg)    Ht 5\' 3"  (1.6 m)   BMI 32.59 kg/m   Assessment and Plan:

## 2021-07-08 LAB — HEMOGLOBIN A1C
Est. average glucose Bld gHb Est-mCnc: 126 mg/dL
Hgb A1c MFr Bld: 6 % — ABNORMAL HIGH (ref 4.8–5.6)

## 2021-07-08 LAB — COMPREHENSIVE METABOLIC PANEL
ALT: 82 IU/L — ABNORMAL HIGH (ref 0–32)
AST: 62 IU/L — ABNORMAL HIGH (ref 0–40)
Albumin/Globulin Ratio: 1.5 (ref 1.2–2.2)
Albumin: 4 g/dL (ref 3.8–4.8)
Alkaline Phosphatase: 51 IU/L (ref 44–121)
BUN/Creatinine Ratio: 14 (ref 9–23)
BUN: 10 mg/dL (ref 6–24)
Bilirubin Total: 0.4 mg/dL (ref 0.0–1.2)
CO2: 20 mmol/L (ref 20–29)
Calcium: 9.2 mg/dL (ref 8.7–10.2)
Chloride: 103 mmol/L (ref 96–106)
Creatinine, Ser: 0.74 mg/dL (ref 0.57–1.00)
Globulin, Total: 2.7 g/dL (ref 1.5–4.5)
Glucose: 92 mg/dL (ref 70–99)
Potassium: 4.3 mmol/L (ref 3.5–5.2)
Sodium: 137 mmol/L (ref 134–144)
Total Protein: 6.7 g/dL (ref 6.0–8.5)
eGFR: 103 mL/min/{1.73_m2} (ref >59)

## 2021-07-08 LAB — TSH: TSH: 6.56 u[IU]/mL — ABNORMAL HIGH (ref 0.450–4.500)

## 2021-07-08 LAB — CBC WITH DIFFERENTIAL/PLATELET
Basophils Absolute: 0 10*3/uL (ref 0.0–0.2)
Basos: 1 %
EOS (ABSOLUTE): 0.3 10*3/uL (ref 0.0–0.4)
Eos: 5 %
Hematocrit: 36.8 % (ref 34.0–46.6)
Hemoglobin: 11.6 g/dL (ref 11.1–15.9)
Immature Grans (Abs): 0 10*3/uL (ref 0.0–0.1)
Immature Granulocytes: 0 %
Lymphocytes Absolute: 2.7 10*3/uL (ref 0.7–3.1)
Lymphs: 44 %
MCH: 26.1 pg — ABNORMAL LOW (ref 26.6–33.0)
MCHC: 31.5 g/dL (ref 31.5–35.7)
MCV: 83 fL (ref 79–97)
Monocytes Absolute: 0.5 10*3/uL (ref 0.1–0.9)
Monocytes: 8 %
Neutrophils Absolute: 2.5 10*3/uL (ref 1.4–7.0)
Neutrophils: 42 %
Platelets: 275 10*3/uL (ref 150–450)
RBC: 4.45 x10E6/uL (ref 3.77–5.28)
RDW: 15.4 % (ref 11.7–15.4)
WBC: 6.1 10*3/uL (ref 3.4–10.8)

## 2021-07-08 LAB — LIPID PANEL
Chol/HDL Ratio: 4.9 ratio — ABNORMAL HIGH (ref 0.0–4.4)
Cholesterol, Total: 188 mg/dL (ref 100–199)
HDL: 38 mg/dL — ABNORMAL LOW (ref >39)
LDL Chol Calc (NIH): 120 mg/dL — ABNORMAL HIGH (ref 0–99)
Triglycerides: 166 mg/dL — ABNORMAL HIGH (ref 0–149)
VLDL Cholesterol Cal: 30 mg/dL (ref 5–40)

## 2021-07-08 LAB — MAGNESIUM: Magnesium: 2.1 mg/dL (ref 1.6–2.3)

## 2021-07-13 ENCOUNTER — Encounter: Payer: 59 | Admitting: Internal Medicine

## 2021-07-18 ENCOUNTER — Ambulatory Visit
Admission: RE | Admit: 2021-07-18 | Discharge: 2021-07-18 | Disposition: A | Discharge status: Home / Self Care | Payer: 59 | Source: Ambulatory Visit | Attending: Internal Medicine | Admitting: Internal Medicine

## 2021-07-18 DIAGNOSIS — R928 Other abnormal and inconclusive findings on diagnostic imaging of breast: Secondary | ICD-10-CM | POA: Insufficient documentation

## 2021-08-10 ENCOUNTER — Encounter: Payer: 59 | Admitting: Obstetrics and Gynecology

## 2021-10-02 NOTE — Progress Notes (Unsigned)
    [name], MD   No chief complaint on file.   HPI:      Ms. Michelle Camacho is a 46 y.o. [id number] whose LMP was No LMP recorded. (Menstrual status: IUD)., presents today for *** IUDi n correct location on u/s  11/21  (Chart says Mirena IUD but exam c/w Paragard.). Placed about a yr ago. Has monthly menses, lasting 4 days, usually no BTB until past 4 months, minimal dysmen. Pt also with mild dyspareunia past 4 months. Feels like there is air in her stomach that causes pain. Has had increased gas and that gives sx relief. Pt has BM 1-2 times daily, normal for her. No urin sx except occas urgency. No vag sx except occas itching. Uses dove sens skin soap, no dryer sheets. In damp underwear regularly.  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   BMI 30.0-30.9,adult 10/20/2019   Encounter for screening mammogram for breast cancer 10/20/2019   IUD (intrauterine device) in place 10/20/2019    Past Surgical History:  Procedure Laterality Date   CESAREAN SECTION      Family History  Problem Relation Age of Onset   Breast cancer Neg Hx     Social History   Socioeconomic History   Marital status: Married    Spouse name: Not on file   Number of children: 6   Years of education: Not on file   Highest education level: Not on file  Occupational History   Not on file  Tobacco Use   Smoking status: Never   Smokeless tobacco: Never  Vaping Use   Vaping Use: Never used  Substance and Sexual Activity   Alcohol use: Not Currently   Drug use: Never   Sexual activity: Yes    Partners: Male    Birth control/protection: I.U.D.  Other Topics Concern   Not on file  Social History Narrative   Not on file   Social Determinants of Health   Financial Resource Strain: Not on file   Food Insecurity: Not on file  Transportation Needs: Not on file  Physical Activity: Not on file  Stress: Not on file  Social Connections: Not on file  Intimate Partner Violence: Not on file    Outpatient Medications Prior to Visit  Medication Sig Dispense Refill   PARAGARD INTRAUTERINE COPPER IU 1 each by Intrauterine route once.     No facility-administered medications prior to visit.      ROS:  Review of Systems BREAST: No symptoms   OBJECTIVE:   Vitals:  There were no vitals taken for this visit.  Physical Exam  Results: No results found for this or any previous visit (from the past 24 hour(s)).   Assessment/Plan: No diagnosis found.    No orders of the defined types were placed in this encounter.     No follow-ups on file.  Charlii Yost B. Cherelle Midkiff, PA-C 10/02/2021 4:51 PM

## 2021-10-03 ENCOUNTER — Ambulatory Visit (INDEPENDENT_AMBULATORY_CARE_PROVIDER_SITE_OTHER): Payer: 59 | Admitting: Obstetrics and Gynecology

## 2021-10-03 ENCOUNTER — Encounter: Payer: Self-pay | Admitting: Obstetrics and Gynecology

## 2021-10-03 VITALS — BP 110/80 | Ht 60.0 in | Wt 197.0 lb

## 2021-10-03 DIAGNOSIS — Z975 Presence of (intrauterine) contraceptive device: Secondary | ICD-10-CM

## 2021-10-03 DIAGNOSIS — N941 Unspecified dyspareunia: Secondary | ICD-10-CM | POA: Diagnosis not present

## 2021-10-03 DIAGNOSIS — N921 Excessive and frequent menstruation with irregular cycle: Secondary | ICD-10-CM | POA: Diagnosis not present

## 2021-10-04 ENCOUNTER — Encounter: Payer: Self-pay | Admitting: Obstetrics and Gynecology

## 2021-11-10 ENCOUNTER — Ambulatory Visit: Payer: 59 | Admitting: Internal Medicine

## 2021-11-10 ENCOUNTER — Encounter: Payer: Self-pay | Admitting: Internal Medicine

## 2021-11-10 VITALS — BP 110/76 | HR 87 | Ht 63.0 in | Wt 192.0 lb

## 2021-11-10 DIAGNOSIS — R7303 Prediabetes: Secondary | ICD-10-CM

## 2021-11-10 DIAGNOSIS — R252 Cramp and spasm: Secondary | ICD-10-CM

## 2021-11-10 DIAGNOSIS — Z975 Presence of (intrauterine) contraceptive device: Secondary | ICD-10-CM | POA: Diagnosis not present

## 2021-11-10 DIAGNOSIS — Z23 Encounter for immunization: Secondary | ICD-10-CM

## 2021-11-10 LAB — POCT GLYCOSYLATED HEMOGLOBIN (HGB A1C): Hemoglobin A1C: 5.8 % — AB (ref 4.0–5.6)

## 2021-11-10 NOTE — Patient Instructions (Addendum)
For improvement in blood sugars, cut back on sweets, breads, rice and other carbohydrates AND work on losing weight - even 5 lbs can be very helpful.  You can use Stevia or Truvia sweeteners  Get magnesium 400 mg and take one a day to reduce muscle cramps

## 2021-11-10 NOTE — Progress Notes (Signed)
Date:  11/10/2021   Name:  Michelle Camacho   DOB:  [birth year]   MRN:  [id number]   Chief Complaint: Prediabetes  Diabetes She presents for her follow-up diabetic visit. Diabetes type: prediab. Her disease course has been stable. Pertinent negatives for hypoglycemia include no headaches or tremors. Pertinent negatives for diabetes include no chest pain, no fatigue, no polydipsia and no polyuria. Current diabetic treatment includes diet. Her weight is stable. She is following a generally healthy diet.   Abnormal mammogram - noted in 2022 with follow up 2023 with no suspicious findings.  Recommend bilat Dx mammo with Right [place] one year.  Breakthrough bleeding on IUD - seen by GYN, reassured,  could consider removing IUD and trying OCPs.  Pt elected to continue with IUD for now.  Lab Results  Component Value Date   NA 137 07/07/2021   K 4.3 07/07/2021   CO2 20 07/07/2021   GLUCOSE 92 07/07/2021   BUN 10 07/07/2021   CREATININE 0.74 07/07/2021   CALCIUM 9.2 07/07/2021   EGFR 103 07/07/2021   Lab Results  Component Value Date   CHOL 188 07/07/2021   HDL 38 (L) 07/07/2021   LDLCALC 120 (H) 07/07/2021   TRIG 166 (H) 07/07/2021   CHOLHDL 4.9 (H) 07/07/2021   Lab Results  Component Value Date   TSH 6.560 (H) 07/07/2021   Lab Results  Component Value Date   HGBA1C 6.0 (H) 07/07/2021   Lab Results  Component Value Date   WBC 6.1 07/07/2021   HGB 11.6 07/07/2021   HCT 36.8 07/07/2021   MCV 83 07/07/2021   PLT 275 07/07/2021   Lab Results  Component Value Date   ALT 82 (H) 07/07/2021   AST 62 (H) 07/07/2021   ALKPHOS 51 07/07/2021   BILITOT 0.4 07/07/2021   No results found for: "25OHVITD2", "25OHVITD3", "VD25OH"   Review of Systems  Constitutional:  Negative for appetite change, fatigue, fever and unexpected weight change.  HENT:  Negative for tinnitus and trouble swallowing.   Eyes:  Negative for visual disturbance.  Respiratory:  Negative for cough, chest  tightness and shortness of breath.   Cardiovascular:  Negative for chest pain, palpitations and leg swelling.  Gastrointestinal:  Negative for abdominal pain.  Endocrine: Negative for polydipsia and polyuria.  Genitourinary:  Negative for dysuria and hematuria.  Musculoskeletal:  Negative for arthralgias.  Neurological:  Negative for tremors, numbness and headaches.  Psychiatric/Behavioral:  Negative for dysphoric mood.     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  Current Meds  Medication Sig   PARAGARD INTRAUTERINE COPPER IU 1 each by Intrauterine route once.       11/10/2021    9:26 AM 06/30/2021   10:34 AM 08/17/2020   11:48 AM 07/08/2020    8:44 AM  GAD 7 : Generalized Anxiety Score  Nervous, Anxious, on Edge 0 0 0 0  Control/stop worrying 0 0 0 0  Worry too much - different things 0 0 0 0  Trouble relaxing 0 0 0 0  Restless 0 0 0 0  Easily annoyed or irritable 0 0 0 0  Afraid - awful might happen 0 0 0 0  Total GAD 7 Score 0 0 0 0  Anxiety Difficulty Not difficult at all Not difficult at all Not difficult at all Not difficult at all       11/10/2021    9:26 AM 06/30/2021   10:33 AM 08/17/2020   11:48 AM  Depression screen PHQ 2/9  Decreased Interest 0 0 0  Down, Depressed, Hopeless 0 0 0  PHQ - 2 Score 0 0 0  Altered sleeping 0 0 0  Tired, decreased energy 0 0 0  Change in  appetite 0 0 0  Feeling bad or failure about yourself  0 0 0  Trouble concentrating 0 0 0  Moving slowly or fidgety/restless 0 0 0  Suicidal thoughts 0 0 0  PHQ-9 Score 0 0 0  Difficult doing work/chores Not difficult at all Not difficult at all Not difficult at all    BP Readings from Last 3 Encounters:  11/10/21 110/76  10/03/21 110/80  06/30/21 124/80    Physical Exam Vitals and nursing note reviewed.  Constitutional:      General: She is not in acute distress.    Appearance: She is well-developed.  HENT:     Head: Normocephalic and atraumatic.  Cardiovascular:     Rate and Rhythm: Normal rate and regular rhythm.  Pulmonary:     Effort: Pulmonary effort is normal. No respiratory distress.     Breath sounds: No wheezing or rhonchi.  Musculoskeletal:        General: No swelling.     Cervical back: Normal range of motion.  Lymphadenopathy:     Cervical: No cervical adenopathy.  Skin:    General: Skin is warm and dry.     Findings: No rash.  Neurological:     Mental Status: She is alert and oriented to person, place, and time.  Psychiatric:        Mood and Affect: Mood normal.        Behavior: Behavior normal.     Wt Readings from Last 3 Encounters:  11/10/21 192 lb (87.1 kg)  10/03/21 197 lb (89.4 kg)  06/30/21 192 lb (87.1 kg)    BP 110/76 (BP Location: Left Arm, Patient Position: Sitting, Cuff Size: Normal)   Pulse 87   Ht _0  (1.6 m)   Wt 192 lb (87.1 kg)   SpO2 97%   BMI 34.01 kg/m   Assessment and Plan: 1. Prediabetes Improved with diet changes Encourage weight loss 5-10 lbs for continued improvement Questions about diet were discussed - esp needs to cut back on tortillas and she is advised she can use Stevia for sweetening in place of sugar. - POCT glycosylated hemoglobin (Hb A1C)= 5.8 down from 6.0  2. IUD (intrauterine device) in place With mild mid cycle spotting which is considered normal She is considering follow up with GYN to have the  IUD removed  3. Cramps of lower extremity These occur at work with spasms of the toes. Recommend Magnesium 400 mg daily and continued sufficient fluids daily.  4. Need for immunization against influenza - Flu Vaccine QUAD 59moIM (Fluarix, Fluzone & Alfiuria  Quad PF)   Partially Psychologist, forensic. Any errors are unintentional.  Halina Maidens, MD [facility]  11/10/2021

## 2022-01-16 ENCOUNTER — Telehealth: Payer: Self-pay

## 2022-01-16 NOTE — Telephone Encounter (Signed)
FD pls call pt to schedule appt (I can help with calling pt).  Pt would like to have IUD removed and start birth control pills. She is still having the irregular bleeding, last two cycles have been about 10 days with regular flow, no abnormal pain.

## 2022-01-16 NOTE — Telephone Encounter (Signed)
Ok Thx 

## 2022-01-17 NOTE — Telephone Encounter (Signed)
I contacted patient use [employer] interpre[employer] Shanon Brow ID [phone]. We contacted patient for scheduling. Patient is scheduled for 01/23/22 at 10:55 am with [facility]. Interpreter has been requested.

## 2022-01-22 NOTE — Progress Notes (Unsigned)
Michelle Hess, MD   No chief complaint on file.   HPI:      Ms. Michelle Camacho is a 47 y.o. [id number] whose LMP was No LMP recorded. (Menstrual status: IUD)., presents today for continued irregular bleeding with Paragard IUD since I saw her 11/21. Placed in 2020. Menses are monthly with 3-4 days mod flow with a few days spotting at the end. Rare dysmen. Every few months, pt has ~7 days of brown d/c with wiping mid cycle. Pt also has occas dyspareunia with small amt of bleeding with wiping for a few hrs afterwards (gone by morning). Pt had GYN u/s 11/21 for same sx which confirmed IUD in correct location, EM=14 mm. 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   BMI 30.0-30.9,adult 10/20/2019   IUD (intrauterine device) in place 10/20/2019    Past Surgical History:  Procedure Laterality Date   CESAREAN SECTION      Family History  Problem Relation Age of Onset   Breast cancer Neg Hx     Social History   Socioeconomic History   Marital status: Married    Spouse name: Not on file   Number of children: 6   Years of education: Not on file   Highest education level: Not on file  Occupational History   Not on file  Tobacco Use   Smoking status: Never   Smokeless tobacco: Never  Vaping Use   Vaping Use: Never used  Substance and Sexual Activity   Alcohol use: Not Currently   Drug use: Never   Sexual activity: Yes    Partners: Male    Birth control/protection: I.U.D.  Other Topics Concern   Not on file  Social History Narrative   Not on file   Social Determinants of Health   Financial Resource Strain: Not on file  Food Insecurity: Not on file  Transportation Needs: Not on file  Physical Activity: Not on file  Stress: Not on  file  Social Connections: Not on file  Intimate Partner Violence: Not on file    Outpatient Medications Prior to Visit  Medication Sig Dispense Refill   PARAGARD INTRAUTERINE COPPER IU 1 each by Intrauterine route once.     No facility-administered medications prior to visit.      ROS:  Review of Systems  Constitutional:  Negative for fever.  Gastrointestinal:  Negative for blood in stool, constipation, diarrhea, nausea and vomiting.  Genitourinary:  Positive for dyspareunia and menstrual problem. Negative for dysuria, flank pain, frequency, hematuria, urgency, vaginal bleeding, vaginal discharge and vaginal pain.  Musculoskeletal:  Negative for back pain.  Skin:  Negative for rash.   BREAST: No symptoms   OBJECTIVE:   Vitals:  There were no vitals taken for this visit.  Physical Exam Vitals reviewed.  Constitutional:      Appearance: She is well-developed.  Pulmonary:     Effort: Pulmonary effort is normal.  Genitourinary:    General: Normal vulva.     Pubic Area: No rash.      Labia:        Right: No rash, tenderness or lesion.        Left: No rash, tenderness or lesion.      Vagina: Bleeding present.  No vaginal discharge, erythema or tenderness.     Cervix: Normal.     Uterus: Normal. Not enlarged and not tender.      Adnexa: Right adnexa normal and left adnexa normal.       Right: No mass or tenderness.         Left: No mass or tenderness.       Comments: IUD STRINGS IN CX OS Musculoskeletal:        General: Normal range of motion.     Cervical back: Normal range of motion.  Skin:    General: Skin is warm and dry.  Neurological:     General: No focal deficit present.     Mental Status: She is alert and oriented to person, place, and time.  Psychiatric:        Mood and Affect: Mood normal.        Behavior: Behavior normal.        Thought Content: Thought content normal.        Judgment: Judgment normal.     No chief complaint on  file.    History of Present Illness:  Michelle Camacho is a 47 y.o. that had a {IUD:23561} IUD placed approximately {NUMBERS:20191} {MONTH/YR:310907} ago. Since that time, she denies dyspareunia, pelvic pain, non-menstrual bleeding, vaginal d/c, heavy bleeding.    There were no vitals taken for this visit.  Pelvic exam:  Two IUD strings {DESC; PRESENT/ABSENT:17923} seen coming from the cervical os. EGBUS, vaginal vault and cervix: within normal limits  IUD Removal Strings of IUD identified and grasped.  IUD removed without problem with ring forceps.  Pt tolerated this well.  IUD noted to be intact.  Assessment:  No diagnosis found.    Plan: IUD removed and plan for contraception is {PLAN CONTRACEPTION:313102}. She was amenable to this plan.  Michelle Camacho B. Michelle Nygren, PA-C 01/22/2022 10:05 PM     Assessment/Plan: Breakthrough bleeding with IUD--IUD string in cx os. Pt had normal Gyn u/s for IUD placement 11/21 with same sx. Most likely having ovulation spotting. Reassurance. Can remove IUD and try different BC. Pt to consider and f/u if desires. Wants to keep IUD for now.    Dyspareunia in female--occas sx, neg GYN u/s for same sx 11/21.Most likely related to IUD. Can remove and try different BC. Pt to consider and f/u prn     No follow-ups on file.  Michelle Camacho B. Michelle Ulysse, PA-C 01/22/2022 10:04 PM

## 2022-01-23 ENCOUNTER — Ambulatory Visit (INDEPENDENT_AMBULATORY_CARE_PROVIDER_SITE_OTHER): Payer: 59 | Admitting: Obstetrics and Gynecology

## 2022-01-23 ENCOUNTER — Encounter: Payer: Self-pay | Admitting: Obstetrics and Gynecology

## 2022-01-23 VITALS — BP 124/80 | Ht 62.0 in | Wt 196.0 lb

## 2022-01-23 DIAGNOSIS — N939 Abnormal uterine and vaginal bleeding, unspecified: Secondary | ICD-10-CM | POA: Diagnosis not present

## 2022-01-23 DIAGNOSIS — Z30011 Encounter for initial prescription of contraceptive pills: Secondary | ICD-10-CM | POA: Diagnosis not present

## 2022-01-23 DIAGNOSIS — Z30432 Encounter for removal of intrauterine contraceptive device: Secondary | ICD-10-CM

## 2022-01-23 MED ORDER — MICROGESTIN 24 FE 1-20 MG-MCG PO TABS: 1.0000 | ORAL_TABLET | Freq: Every day | ORAL | 3 refills | Status: DC

## 2022-01-23 NOTE — Patient Instructions (Signed)
I value your feedback and you entrusting us with your care. If you get a [facility] patient survey, I would appreciate you taking the time to let us know about your experience today. Thank you! ? ? ?

## 2022-04-12 IMAGING — MG MM DIGITAL DIAGNOSTIC UNILAT*R* W/ TOMO W/ CAD
6 series · 6 of 18 positions shown · non-contrast
Comparison: Previous exam(s).

CLINICAL DATA: Screening recall from baseline for 2 right breast
masses.

EXAM:
DIGITAL DIAGNOSTIC UNILATERAL RIGHT MAMMOGRAM WITH TOMOSYNTHESIS AND
CAD; ULTRASOUND RIGHT BREAST LIMITED
TECHNIQUE: Right digital diagnostic mammography and breast tomosynthesis was
performed. The images were evaluated with computer-aided detection.;
Targeted ultrasound examination of the right breast was performed

[R CC synth-2D (1 of 2)]
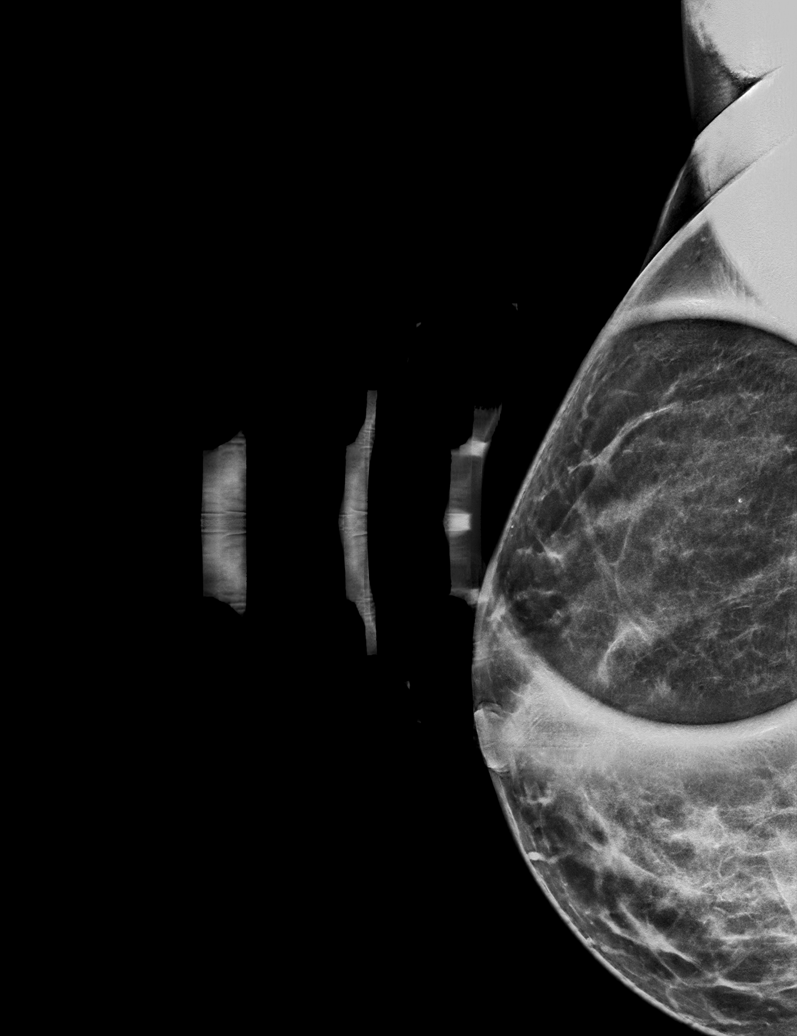

[R CC synth-2D (2 of 2)]
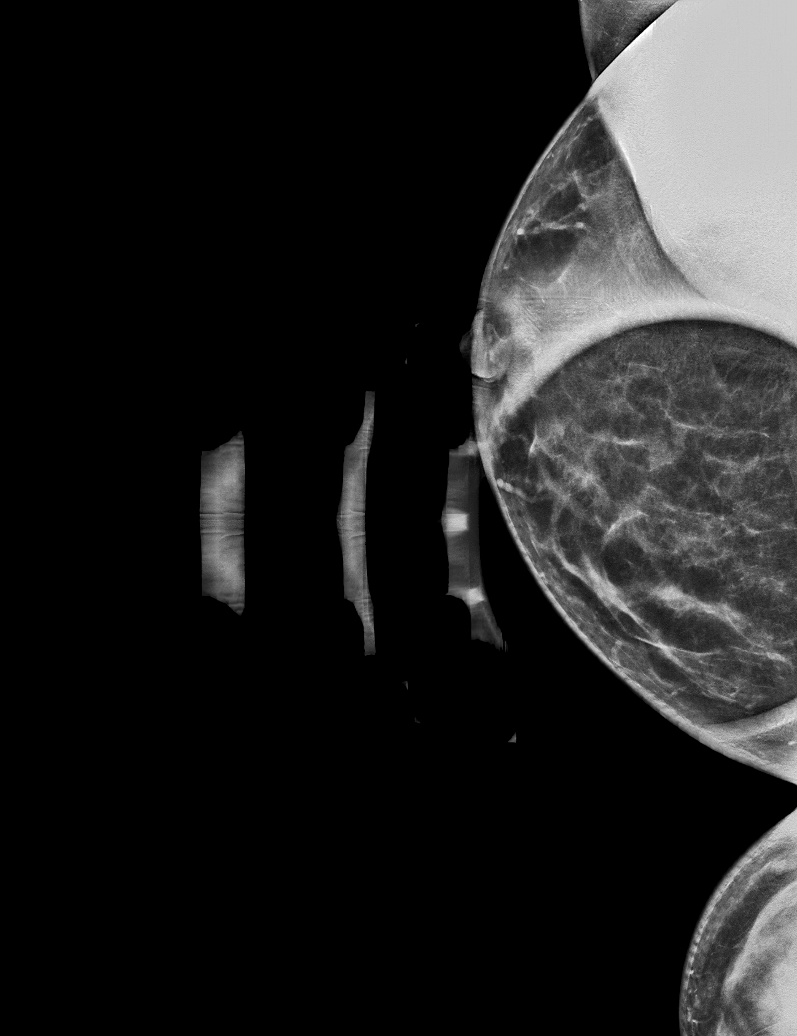

[R MLO synth-2D]
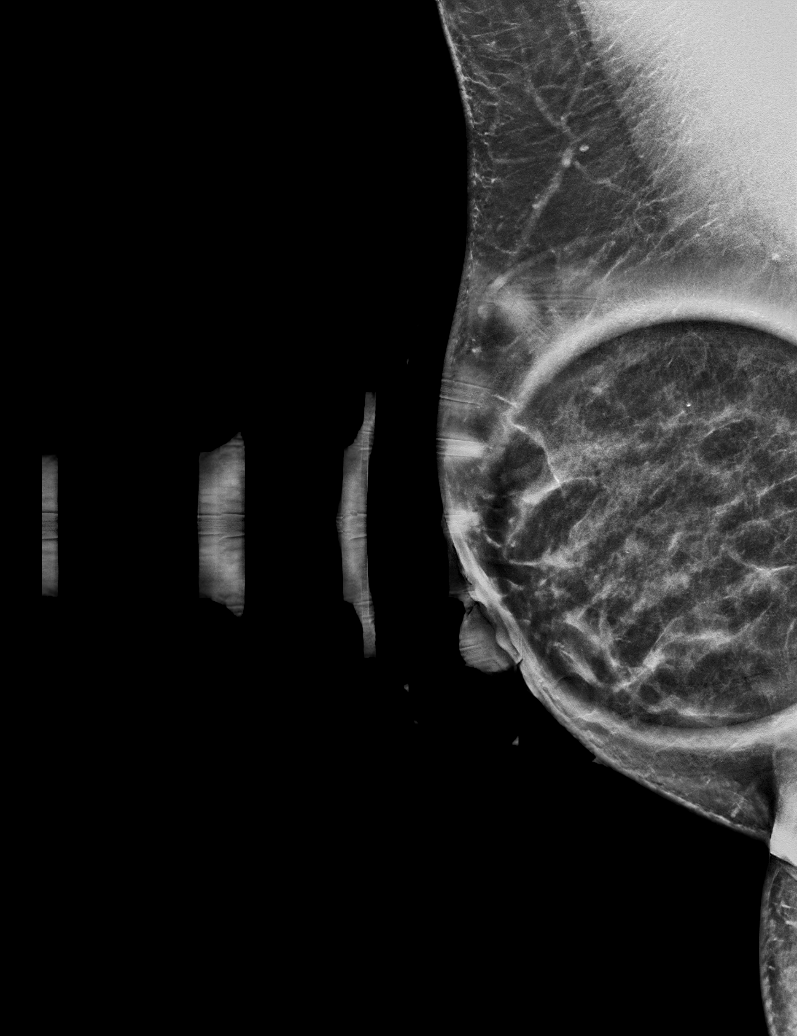

[R CC tomo (1 of 2) · tomo slice 31/60.0]
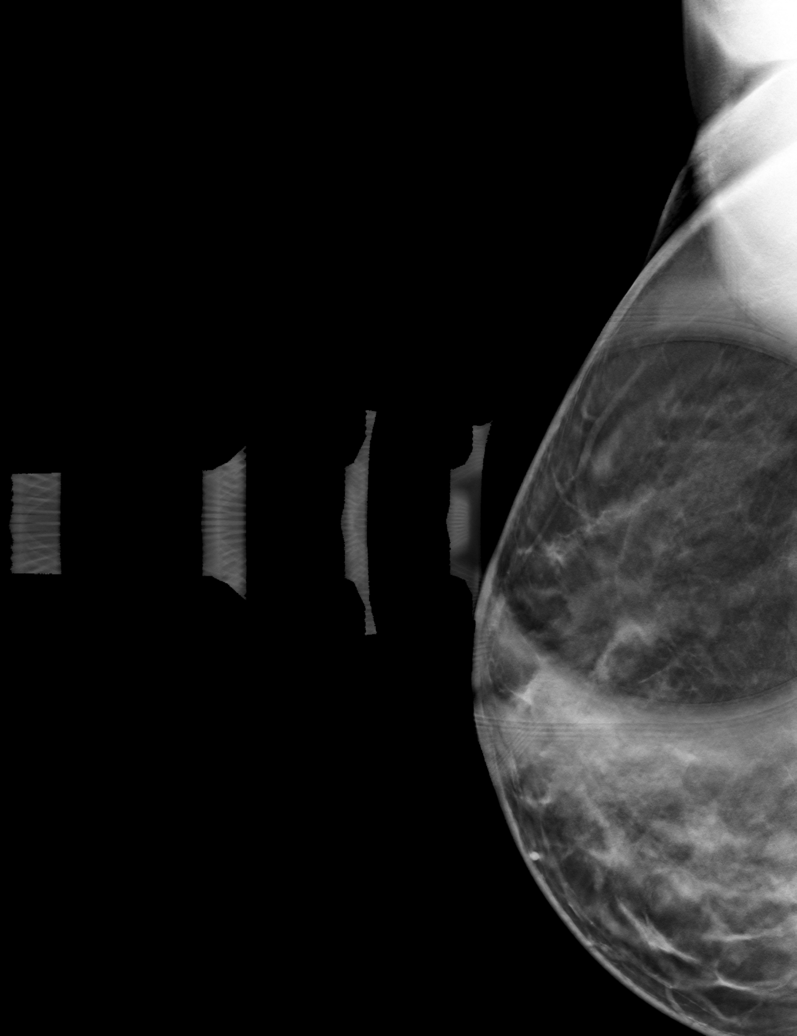

[R MLO tomo · tomo slice 32/63.0]
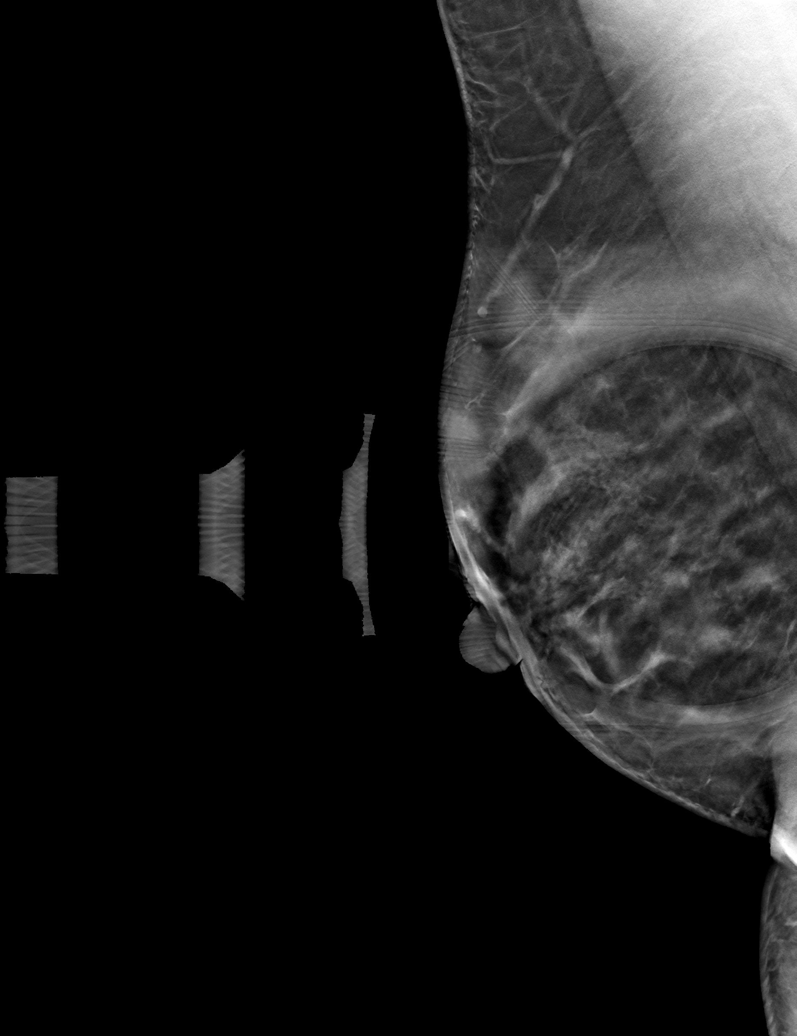

[R CC tomo (2 of 2) · tomo slice 30/59.0]
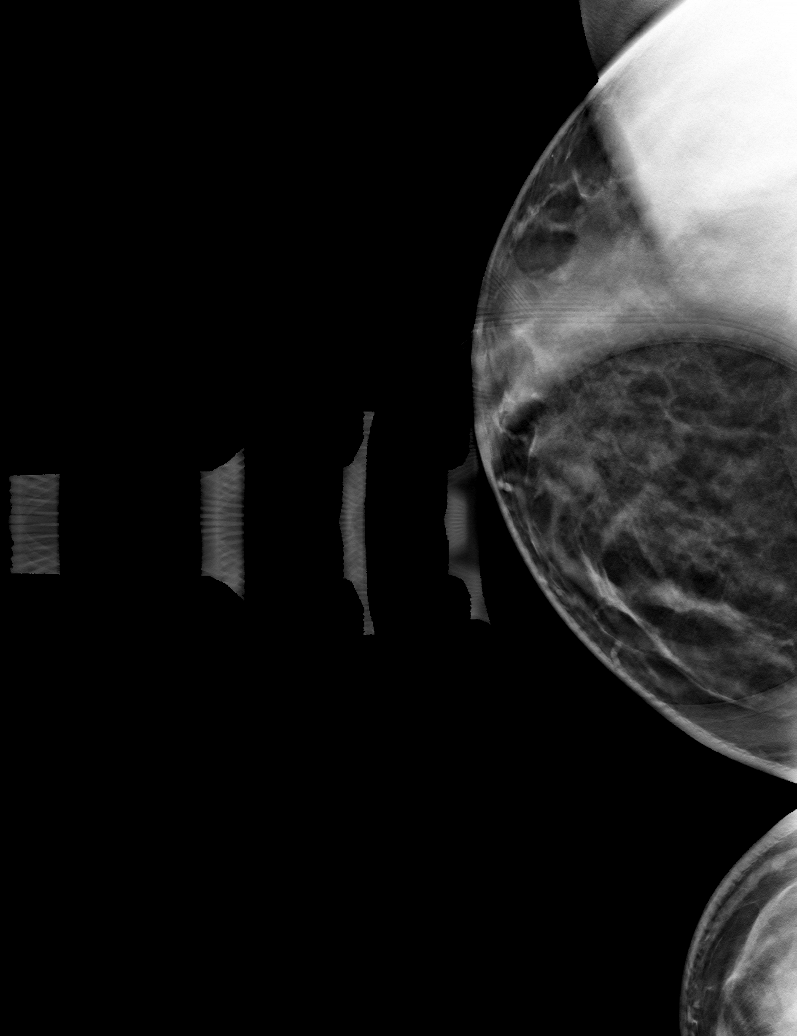

[6 of 18 positions shown; findings below may reference images not displayed]

ACR Breast Density Category c: The breast tissue is heterogeneously
dense, which may obscure small masses.
FINDINGS: Spot compression tomosynthesis images through the lateral middle
depth of the right breast demonstrates a possible obscured mass.
Spot compression tomosynthesis images through the medial right
breast demonstrates no persistent mammographic abnormalities.

Ultrasound of the right breast at 9 o'clock, 1 cm from the nipple
demonstrates 2 adjacent anechoic oval circumscribed masses, the
larger measuring 8 mm. At [DATE], 1 cm from the nipple there is a
hypoechoic oval mass measuring 1.1 x 0.4 x 0.6 cm. No blood flow is
seen within the mass on color Doppler imaging.
IMPRESSION: 1. There is a likely benign right breast mass at [DATE]. This is
favored to represent a fibroadenoma versus a complicated cyst.

2.  Benign fibrocystic changes noted in the lateral right breast.

RECOMMENDATION:
Six-month follow-up right breast ultrasound for the mass at [DATE].

I have discussed the findings and recommendations with the patient.
If applicable, a reminder letter will be sent to the patient
regarding the next appointment.

BI-RADS CATEGORY  3: Probably benign.

## 2022-04-12 IMAGING — US US BREAST*R* LIMITED INC AXILLA
1 series · 9 of 9 positions shown · non-contrast
Comparison: Previous exam(s).

CLINICAL DATA: Screening recall from baseline for 2 right breast
masses.

EXAM:
DIGITAL DIAGNOSTIC UNILATERAL RIGHT MAMMOGRAM WITH TOMOSYNTHESIS AND
CAD; ULTRASOUND RIGHT BREAST LIMITED
TECHNIQUE: Right digital diagnostic mammography and breast tomosynthesis was
performed. The images were evaluated with computer-aided detection.;
Targeted ultrasound examination of the right breast was performed

[Series 1: us breast*right* limited inc axilla · 0.05mm/px · 9 of 9 slices shown]
[im 1/9]
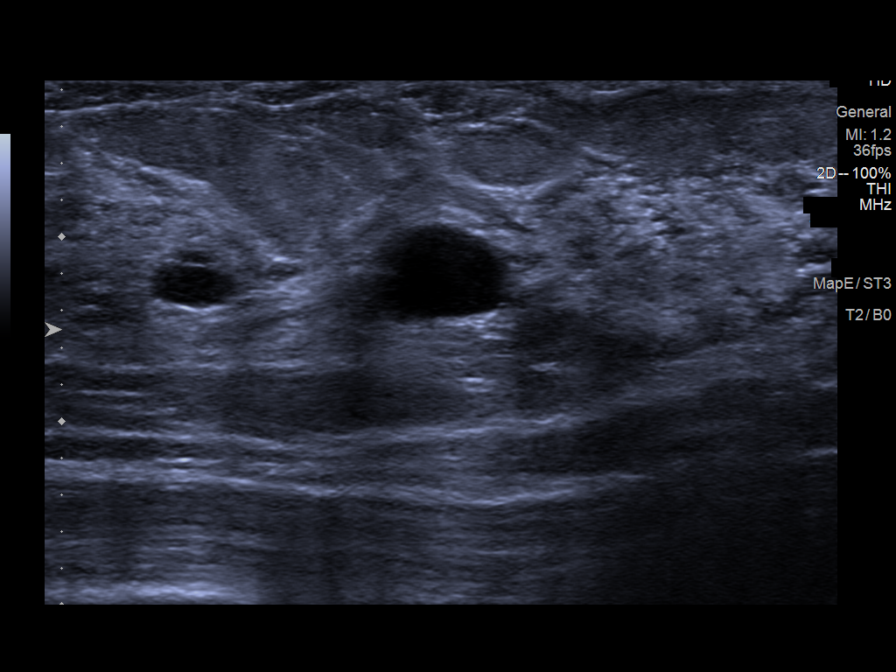
[im 2/9]
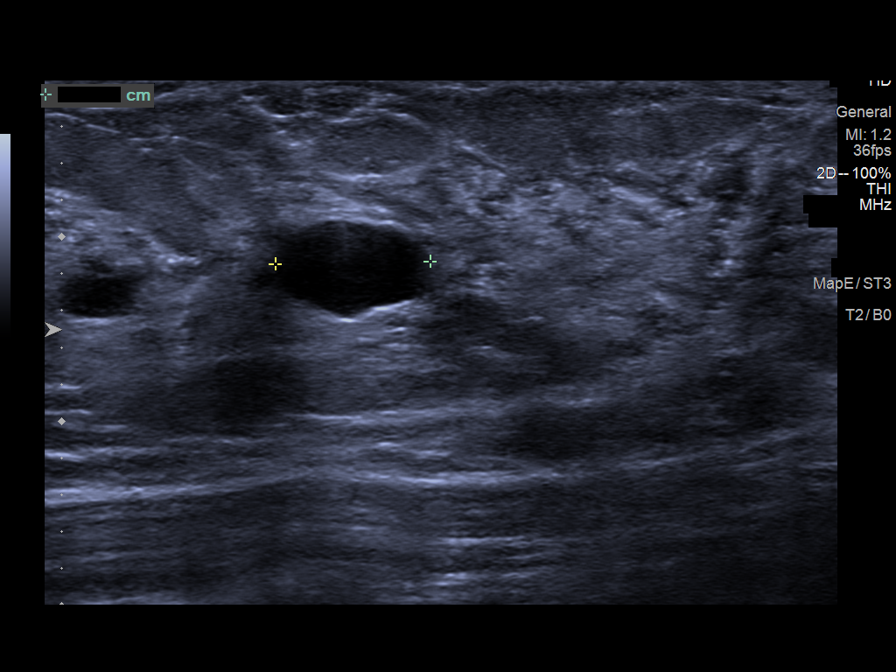
[im 3/9]
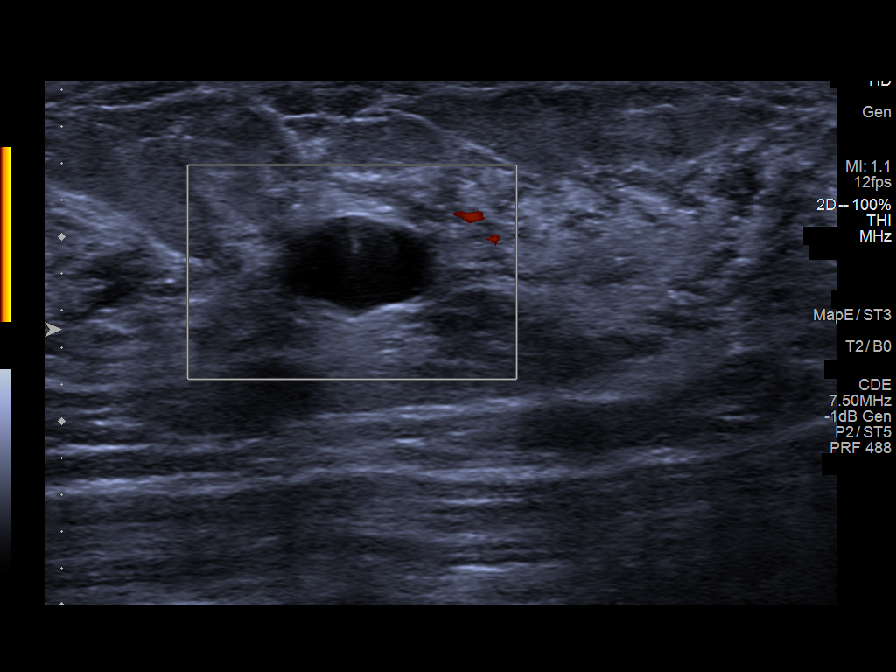
[im 4/9]
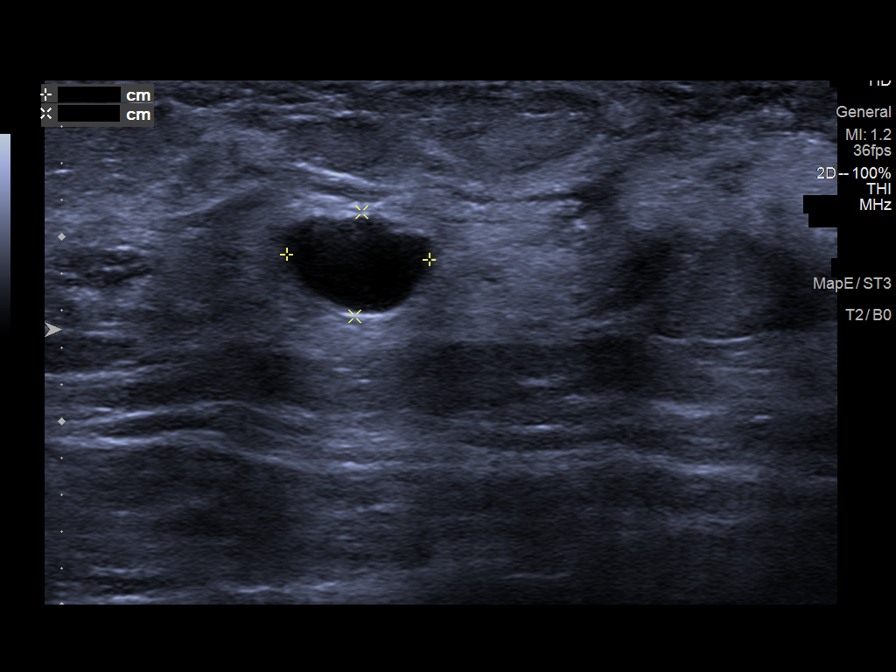
[im 5/9]
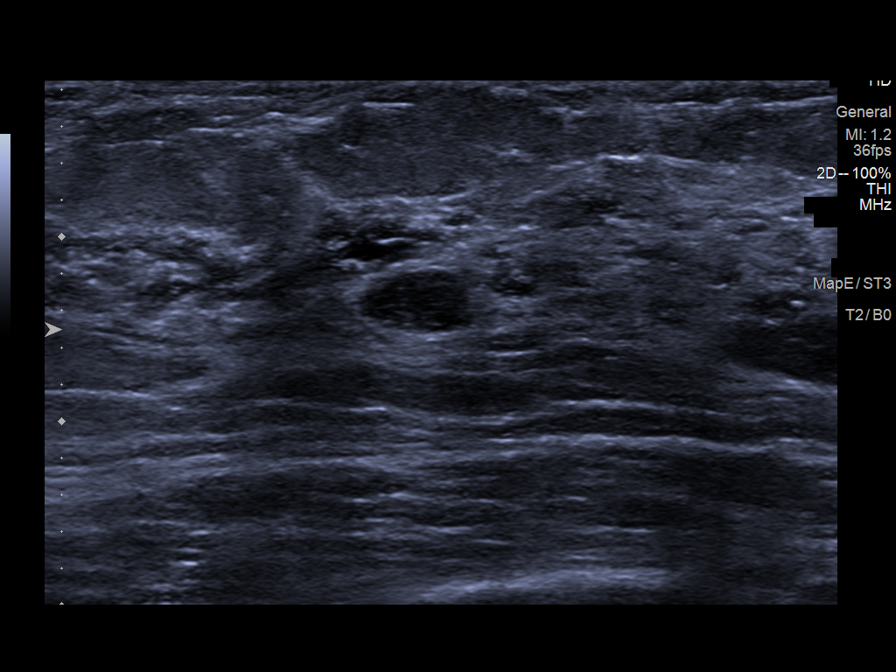
[im 6/9]
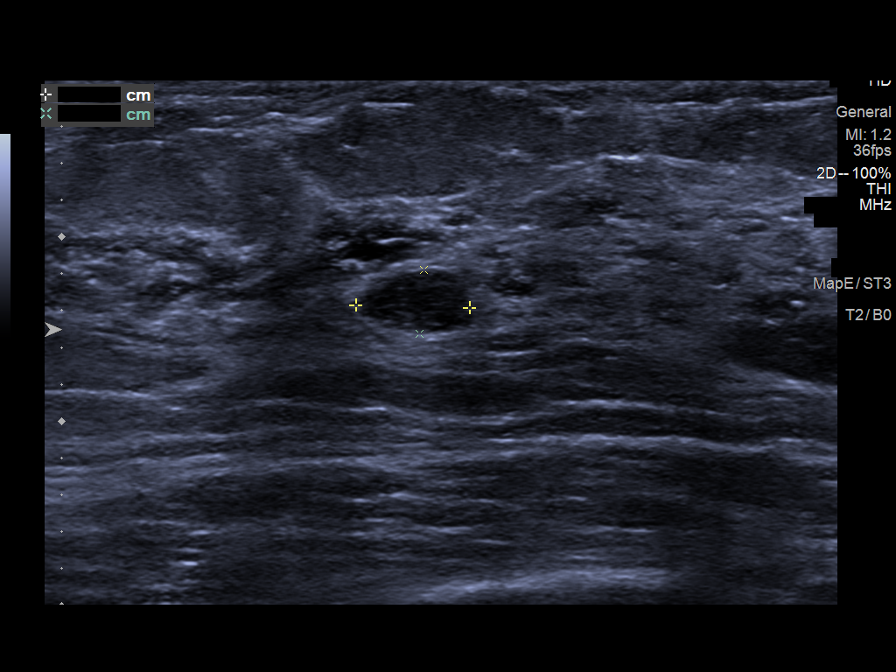
[im 7/9]
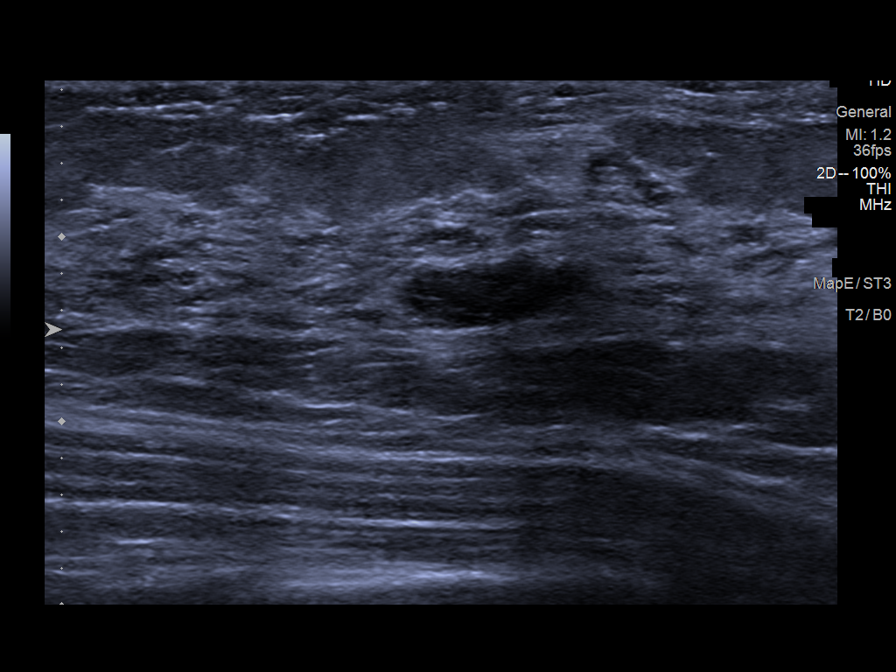
[im 8/9]
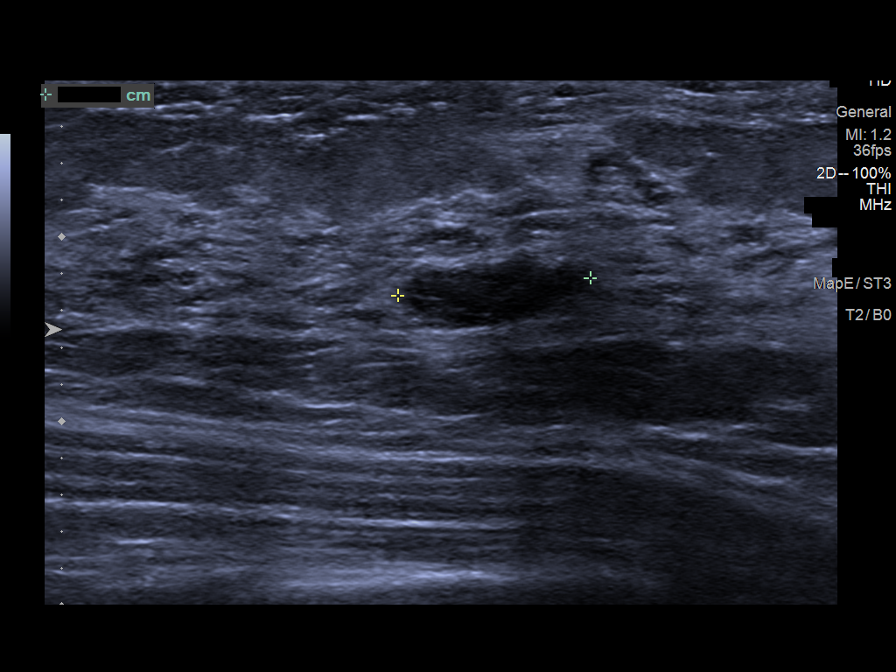
[im 9/9]
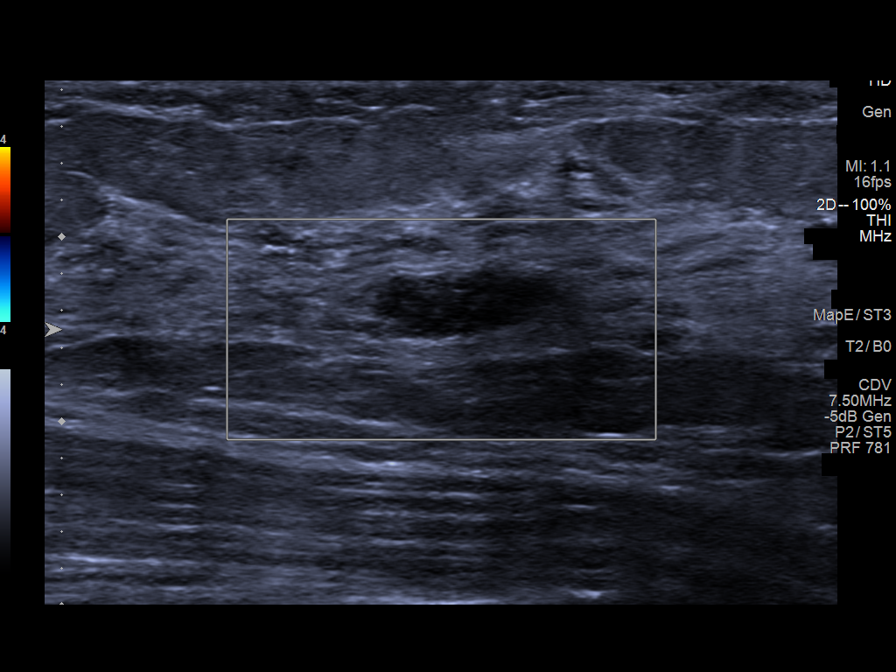

[9 of 9 positions shown; findings below may reference images not displayed]

ACR Breast Density Category c: The breast tissue is heterogeneously
dense, which may obscure small masses.
FINDINGS: Spot compression tomosynthesis images through the lateral middle
depth of the right breast demonstrates a possible obscured mass.
Spot compression tomosynthesis images through the medial right
breast demonstrates no persistent mammographic abnormalities.

Ultrasound of the right breast at 9 o'clock, 1 cm from the nipple
demonstrates 2 adjacent anechoic oval circumscribed masses, the
larger measuring 8 mm. At [DATE], 1 cm from the nipple there is a
hypoechoic oval mass measuring 1.1 x 0.4 x 0.6 cm. No blood flow is
seen within the mass on color Doppler imaging.
IMPRESSION: 1. There is a likely benign right breast mass at [DATE]. This is
favored to represent a fibroadenoma versus a complicated cyst.

2.  Benign fibrocystic changes noted in the lateral right breast.

RECOMMENDATION:
Six-month follow-up right breast ultrasound for the mass at [DATE].

I have discussed the findings and recommendations with the patient.
If applicable, a reminder letter will be sent to the patient
regarding the next appointment.

BI-RADS CATEGORY  3: Probably benign.

## 2022-07-05 ENCOUNTER — Other Ambulatory Visit: Payer: Self-pay | Admitting: Internal Medicine

## 2022-07-05 DIAGNOSIS — R928 Other abnormal and inconclusive findings on diagnostic imaging of breast: Secondary | ICD-10-CM

## 2022-07-05 DIAGNOSIS — N63 Unspecified lump in unspecified breast: Secondary | ICD-10-CM

## 2022-07-05 DIAGNOSIS — Z1231 Encounter for screening mammogram for malignant neoplasm of breast: Secondary | ICD-10-CM

## 2022-07-11 ENCOUNTER — Encounter: Payer: 59 | Admitting: Internal Medicine

## 2022-07-27 ENCOUNTER — Ambulatory Visit
Admission: RE | Admit: 2022-07-27 | Discharge: 2022-07-27 | Disposition: A | Discharge status: Home / Self Care | Payer: 59 | Source: Ambulatory Visit | Attending: Internal Medicine | Admitting: Internal Medicine

## 2022-07-27 ENCOUNTER — Other Ambulatory Visit: Payer: Self-pay | Admitting: Internal Medicine

## 2022-07-27 ENCOUNTER — Telehealth: Payer: Self-pay | Admitting: Internal Medicine

## 2022-07-27 DIAGNOSIS — Z1231 Encounter for screening mammogram for malignant neoplasm of breast: Secondary | ICD-10-CM | POA: Insufficient documentation

## 2022-07-27 DIAGNOSIS — R928 Other abnormal and inconclusive findings on diagnostic imaging of breast: Secondary | ICD-10-CM

## 2022-07-27 DIAGNOSIS — N63 Unspecified lump in unspecified breast: Secondary | ICD-10-CM

## 2022-07-27 NOTE — Telephone Encounter (Signed)
Copied from CRM [id number]. Topic: General - Other >> Jul 27, 2022  2:56 PM [place] B wrote: Reason for CRM: Joni Reining from [facility] called in about ultrasound order signed, she says it already in the system for order, they see a mass, and want to get this done asap since Dr berglund or he nurse is not in, she is asking can another nurse do it. I made no guarantees if this can be done. Please call back

## 2022-10-29 ENCOUNTER — Other Ambulatory Visit (HOSPITAL_COMMUNITY)
Admission: RE | Admit: 2022-10-29 | Discharge: 2022-10-29 | Disposition: A | Discharge status: Home / Self Care | Payer: No Typology Code available for payment source | Source: Ambulatory Visit | Attending: Internal Medicine | Admitting: Internal Medicine

## 2022-10-29 ENCOUNTER — Ambulatory Visit: Payer: Self-pay

## 2022-10-29 ENCOUNTER — Encounter: Payer: Self-pay | Admitting: Internal Medicine

## 2022-10-29 ENCOUNTER — Ambulatory Visit (INDEPENDENT_AMBULATORY_CARE_PROVIDER_SITE_OTHER): Payer: No Typology Code available for payment source | Admitting: Internal Medicine

## 2022-10-29 VITALS — BP 128/78 | HR 78 | Ht 62.0 in | Wt 195.0 lb

## 2022-10-29 DIAGNOSIS — R252 Cramp and spasm: Secondary | ICD-10-CM | POA: Insufficient documentation

## 2022-10-29 DIAGNOSIS — R7303 Prediabetes: Secondary | ICD-10-CM | POA: Diagnosis not present

## 2022-10-29 DIAGNOSIS — N761 Subacute and chronic vaginitis: Secondary | ICD-10-CM

## 2022-10-29 MED ORDER — FLUCONAZOLE 100 MG PO TABS: 100.0000 mg | ORAL_TABLET | Freq: Every day | ORAL | 0 refills | Status: AC

## 2022-10-29 NOTE — Assessment & Plan Note (Addendum)
Lab Results  Component Value Date   HGBA1C 5.8 (A) 11/10/2021  Advised low carb diet.  Weight has been stable. She admits that she is not following a low carb diet. Will check labs given recurrent presumed candida vaginitis.

## 2022-10-29 NOTE — Telephone Encounter (Signed)
Using [facility]#[id number].  Chief Complaint: Vaginal itching Symptoms: Vaginal itching, mild odor  Frequency: constant Pertinent Negatives: Patient denies vaginal discharge, pain, fever, blood in urine Disposition: [] ED /[] Urgent Care (no appt availability in office) / [x] Appointment(In office/virtual)/ []  [facility]/ [] Home Care/ [] Refused Recommended Disposition /[] [facility]/ []  Follow-up with PCP Additional Notes: Patient states she has had vaginal itching for about 2 weeks that is getting worse. Patient has tried over the counter medication for treatment without improvement. Patient stated she has a mild vaginal odor as well.  Care advice was given and patient has been scheduled for an appointment today at 1620 with PCP. Advised patient to callback if symptoms get worse. Patient verbalized understanding. Summary: vaginal itching   Pt states that one month ago she had a lot of itching in her vaginal area and was given 7 days worth of cubes to use. Pt had her period last week and now she is back itching and is wanting to see if the cubes could be called in for her again. Pt does not know the name of the medication.  Please advise.   [id number] - [place], [place] - [address] Phone: [phone] Fax: [phone]     Reason for Disposition  MODERATE-SEVERE itching (i.e., interferes with school, work, or sleep)  Answer Assessment - Initial Assessment Questions 1. SYMPTOM: "What's the main symptom you're concerned about?" (e.g., pain, itching, dryness)     Itching 2. LOCATION: "Where is the  itching located?" (e.g., inside/outside, left/right)     Inside and outside, all over  3. ONSET: "When did the  itching  start?"     2 weeks ago  4. PAIN: "Is there any pain?" If Yes, ask: "How bad is it?" (Scale: 1-10; mild, moderate, severe)   -  MILD (1-3): Doesn't interfere with normal activities.    -  MODERATE (4-7): Interferes with normal  activities (e.g., work or school) or awakens from sleep.     -  SEVERE (8-10): Excruciating pain, unable to do any normal activities.     No  5. ITCHING: "Is there any itching?" If Yes, ask: "How bad is it?" (Scale: 1-10; mild, moderate, severe)     Severe  6. CAUSE: "What do you think is causing the discharge?" "Have you had the same problem before? What happened then?"     Yeast infection  7. OTHER SYMPTOMS: "Do you have any other symptoms?" (e.g., fever, itching, vaginal bleeding, pain with urination, injury to genital area, vaginal foreign body)     No  Protocols used: Vaginal Symptoms-A-AH

## 2022-10-29 NOTE — Assessment & Plan Note (Signed)
Recommend resuming Magnesium 400 mg daily. Compression stockings to be worn during the day - off a bedtime.

## 2022-10-29 NOTE — Progress Notes (Signed)
Date:  10/29/2022   Name:  Michelle Camacho   DOB:  15-Feb-1975   MRN:  [id number]   Chief Complaint: Vaginal Itching (Started 1 month ago. Used otc cream for yeast infection, and symptom still came back. )  Vaginal Itching The patient's primary symptoms include genital itching and vaginal discharge (and itching). This is a recurrent problem. The current episode started 1 to 4 weeks ago. The problem occurs constantly. The problem has been unchanged. The patient is experiencing no pain. She is not pregnant. Pertinent negatives include no chills or fever.  Diabetes She presents for her follow-up diabetic visit. Diabetes type: prediabetes. Pertinent negatives for diabetes include no chest pain and no fatigue. When asked about current treatments, none were reported.  Leg cramps - mostly at night, tried magnesium without benefit.  Has not tried compression stockings. She works on her feet all day on concrete.  Review of Systems  Constitutional:  Negative for chills, fatigue and fever.  Respiratory:  Negative for chest tightness and shortness of breath.   Cardiovascular:  Negative for chest pain.  Genitourinary:  Positive for vaginal discharge (and itching).  Musculoskeletal:  Positive for myalgias.  Psychiatric/Behavioral:  Positive for sleep disturbance (from leg cramps).      Lab Results  Component Value Date   NA 137 07/07/2021   K 4.3 07/07/2021   CO2 20 07/07/2021   GLUCOSE 92 07/07/2021   BUN 10 07/07/2021   CREATININE 0.74 07/07/2021   CALCIUM 9.2 07/07/2021   EGFR 103 07/07/2021   Lab Results  Component Value Date   CHOL 188 07/07/2021   HDL 38 (L) 07/07/2021   LDLCALC 120 (H) 07/07/2021   TRIG 166 (H) 07/07/2021   CHOLHDL 4.9 (H) 07/07/2021   Lab Results  Component Value Date   TSH 6.560 (H) 07/07/2021   Lab Results  Component Value Date   HGBA1C 5.8 (A) 11/10/2021   Lab Results  Component Value Date   WBC 6.1 07/07/2021   HGB 11.6 07/07/2021   HCT  36.8 07/07/2021   MCV 83 07/07/2021   PLT 275 07/07/2021   Lab Results  Component Value Date   ALT 82 (H) 07/07/2021   AST 62 (H) 07/07/2021   ALKPHOS 51 07/07/2021   BILITOT 0.4 07/07/2021   No results found for: "25OHVITD2", "25OHVITD3", "VD25OH"   Patient Active Problem List   Diagnosis Date Noted   Muscle cramps at night 10/29/2022   Prediabetes 11/10/2021   Mixed hyperlipidemia 06/30/2021   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  Current Meds  Medication Sig   fluconazole (DIFLUCAN) 100 MG tablet Take 1 tablet (100 mg total) by mouth daily for 3 days.   Norethindrone Acetate-Ethinyl Estrad-FE (MICROGESTIN 24 FE) 1-20 MG-MCG(24) tablet Take 1 tablet by mouth daily.       10/29/2022    4:15 PM 11/10/2021    9:26 AM 06/30/2021   10:34 AM  08/17/2020   11:48 AM  GAD 7 : Generalized Anxiety Score  Nervous, Anxious, on Edge 0 0 0 0  Control/stop worrying 0 0 0 0  Worry too much - different things 0 0 0 0  Trouble relaxing 0 0 0 0  Restless 0 0 0 0  Easily annoyed or irritable 0 0 0 0  Afraid - awful might happen 0 0 0 0  Total GAD 7 Score 0 0 0 0  Anxiety Difficulty Not difficult at all Not difficult at all Not difficult at all Not difficult at all       10/29/2022    4:15 PM 11/10/2021    9:26 AM 06/30/2021   10:33 AM  Depression screen PHQ 2/9  Decreased Interest 0 0 0  Down, Depressed, Hopeless 0 0 0  PHQ - 2 Score 0 0 0  Altered sleeping 0 0 0  Tired, decreased energy 0 0 0   Change in appetite 0 0 0  Feeling bad or failure about yourself  0 0 0  Trouble concentrating 0 0 0  Moving slowly or fidgety/restless 0 0 0  Suicidal thoughts 0 0 0  PHQ-9 Score 0 0 0  Difficult doing work/chores Not difficult at all Not difficult at all Not difficult at all    BP Readings from Last 3 Encounters:  10/29/22 128/78  01/23/22 124/80  11/10/21 110/76    Physical Exam Vitals and nursing note reviewed.  Constitutional:      General: She is not in acute distress.    Appearance: Normal appearance. She is well-developed.  HENT:     Head: Normocephalic and atraumatic.  Neck:     Vascular: No carotid bruit.  Cardiovascular:     Rate and Rhythm: Normal rate and regular rhythm.  Pulmonary:     Effort: Pulmonary effort is normal. No respiratory distress.     Breath sounds: No wheezing or rhonchi.  Musculoskeletal:     Cervical back: Normal range of motion.     Right lower leg: No edema.     Left lower leg: No edema.  Lymphadenopathy:     Cervical: No cervical adenopathy.  Skin:    General: Skin is warm and dry.     Findings: No rash.  Neurological:     General: No focal deficit present.     Mental Status: She is alert and oriented to person, place, and time.  Psychiatric:        Mood and Affect: Mood normal.        Behavior: Behavior normal.     Wt Readings from Last 3 Encounters:  10/29/22 195 lb (88.5 kg)  01/23/22 196 lb (88.9 kg)  11/10/21 192 lb (87.1 kg)    BP 128/78   Pulse 78   Ht 5\' 2"  (1.575 m)   Wt 195 lb (88.5 kg)   SpO2 97%   BMI 35.67 kg/m   Assessment and Plan:  Problem List Items Addressed This Visit       Unprioritized   Muscle cramps at night    Recommend resuming Magnesium 400 mg daily. Compression stockings to be worn during the day - off a bedtime.      Prediabetes - Primary (Chronic)    Lab Results  Component Value Date   HGBA1C 5.8 (A) 11/10/2021  Advised low carb diet.  Weight has been stable. She admits that  she is not following a low carb diet. Will check labs given recurrent presumed candida vaginitis.  Relevant Orders   Basic metabolic panel   Hemoglobin A1c   Other Visit Diagnoses     Subacute vaginitis       suspect Candida exacerbated by prediabetes   Relevant Medications   fluconazole (DIFLUCAN) 100 MG tablet   Other Relevant Orders   Cervicovaginal ancillary only       Return in about 6 months (around 04/29/2023) for CPX.   Visit conducted with assistance of Video interpreter services.  Reubin Milan, MD [facility] [place]

## 2022-10-29 NOTE — Patient Instructions (Signed)
Start Magnesium 400 mg daily  Wear compression stockings - on in the AM and off in the PM.  So not sleep in them.

## 2022-10-30 LAB — BASIC METABOLIC PANEL
BUN/Creatinine Ratio: 18 (ref 9–23)
BUN: 12 mg/dL (ref 6–24)
CO2: 20 mmol/L (ref 20–29)
Calcium: 9.3 mg/dL (ref 8.7–10.2)
Chloride: 99 mmol/L (ref 96–106)
Creatinine, Ser: 0.68 mg/dL (ref 0.57–1.00)
Glucose: 188 mg/dL — ABNORMAL HIGH (ref 70–99)
Potassium: 4.4 mmol/L (ref 3.5–5.2)
Sodium: 137 mmol/L (ref 134–144)
eGFR: 109 mL/min/{1.73_m2} (ref >59)

## 2022-10-30 LAB — HEMOGLOBIN A1C
Est. average glucose Bld gHb Est-mCnc: 140 mg/dL
Hgb A1c MFr Bld: 6.5 % — ABNORMAL HIGH (ref 4.8–5.6)

## 2022-10-31 ENCOUNTER — Other Ambulatory Visit: Payer: Self-pay | Admitting: Internal Medicine

## 2022-10-31 DIAGNOSIS — B9689 Other specified bacterial agents as the cause of diseases classified elsewhere: Secondary | ICD-10-CM

## 2022-10-31 LAB — CERVICOVAGINAL ANCILLARY ONLY
Bacterial Vaginitis (gardnerella): POSITIVE — AB
Candida Glabrata: NEGATIVE
Candida Vaginitis: NEGATIVE
Chlamydia: NEGATIVE
Comment: NEGATIVE
Comment: NEGATIVE
Comment: NEGATIVE
Comment: NEGATIVE
Comment: NEGATIVE
Comment: NORMAL
Neisseria Gonorrhea: NEGATIVE
Trichomonas: NEGATIVE

## 2022-10-31 MED ORDER — METRONIDAZOLE 500 MG PO TABS: 500.0000 mg | ORAL_TABLET | Freq: Two times a day (BID) | ORAL | 0 refills | Status: AC

## 2023-01-08 ENCOUNTER — Ambulatory Visit (INDEPENDENT_AMBULATORY_CARE_PROVIDER_SITE_OTHER): Payer: No Typology Code available for payment source | Admitting: Internal Medicine

## 2023-01-08 ENCOUNTER — Other Ambulatory Visit: Payer: Self-pay | Admitting: Obstetrics and Gynecology

## 2023-01-08 ENCOUNTER — Encounter: Payer: Self-pay | Admitting: Internal Medicine

## 2023-01-08 VITALS — BP 117/72 | HR 98 | Ht 62.0 in | Wt 196.4 lb

## 2023-01-08 DIAGNOSIS — Z0000 Encounter for general adult medical examination without abnormal findings: Secondary | ICD-10-CM | POA: Diagnosis not present

## 2023-01-08 DIAGNOSIS — Z30011 Encounter for initial prescription of contraceptive pills: Secondary | ICD-10-CM

## 2023-01-08 DIAGNOSIS — R7989 Other specified abnormal findings of blood chemistry: Secondary | ICD-10-CM

## 2023-01-08 DIAGNOSIS — R7303 Prediabetes: Secondary | ICD-10-CM

## 2023-01-08 DIAGNOSIS — E782 Mixed hyperlipidemia: Secondary | ICD-10-CM

## 2023-01-08 DIAGNOSIS — R748 Abnormal levels of other serum enzymes: Secondary | ICD-10-CM

## 2023-01-08 DIAGNOSIS — Z1211 Encounter for screening for malignant neoplasm of colon: Secondary | ICD-10-CM | POA: Diagnosis not present

## 2023-01-08 DIAGNOSIS — Z1231 Encounter for screening mammogram for malignant neoplasm of breast: Secondary | ICD-10-CM

## 2023-01-08 LAB — POCT URINE PREGNANCY: Preg Test, Ur: NEGATIVE

## 2023-01-08 NOTE — Assessment & Plan Note (Signed)
 Blood sugars stable without hypoglycemic symptoms or events. Currently managed with diet. Changes made last visit are none. Diet and weight loss re-enforced. Lab Results  Component Value Date   HGBA1C 6.5 (H) 10/29/2022

## 2023-01-08 NOTE — Progress Notes (Signed)
 Date:  01/08/2023   Name:  Michelle Camacho   DOB:  22-Jul-1975   MRN:  [id number]   Chief Complaint: Annual Exam (Pt wants a breast exam as well. Pt would like to discuss getting a colonoscopy set up as well.) Michelle Camacho is a 47 y.o. female who presents today for her Complete Annual Exam. She feels well. She reports exercising none. She reports she is sleeping well. Breast complaints none. She did not have a period this month - did not miss any pills and resumed the next pack on time.  She has some hot flashes but no sweats.   Mammogram: 07/2022 bil simple cysts DEXA: none Colonoscopy: none Pap: 07/2020 neg/neg  Health Maintenance Due  Topic Date Due   Colonoscopy  Never done    Immunization History  Administered Date(s) Administered   Influenza,inj,Quad PF,6+ Mos 10/20/2019, 11/10/2021   Influenza-Unspecified 10/19/2016   Moderna Sars-Covid-2 Vaccination 03/02/2019, 03/31/2019, 01/15/2020   Tdap 10/19/2016    HPI  Review of Systems  Constitutional:  Negative for fatigue and unexpected weight change.  Eyes:  Negative for visual disturbance.  Respiratory:  Negative for cough, chest tightness, shortness of breath and wheezing.   Cardiovascular:  Negative for chest pain, palpitations and leg swelling.  Gastrointestinal:  Negative for abdominal pain, constipation and diarrhea.  Genitourinary:  Positive for menstrual problem (missed period this month).  Musculoskeletal:  Negative for arthralgias.  Neurological:  Negative for dizziness, weakness and headaches.  Psychiatric/Behavioral:  Negative for dysphoric mood and sleep disturbance. The patient is not nervous/anxious.      Lab Results  Component Value Date   NA 137 10/29/2022   K 4.4 10/29/2022   CO2 20 10/29/2022   GLUCOSE 188 (H) 10/29/2022   BUN 12 10/29/2022   CREATININE 0.68 10/29/2022   CALCIUM 9.3 10/29/2022   EGFR 109 10/29/2022   Lab Results  Component Value Date   CHOL 188  07/07/2021   HDL 38 (L) 07/07/2021   LDLCALC 120 (H) 07/07/2021   TRIG 166 (H) 07/07/2021   CHOLHDL 4.9 (H) 07/07/2021   Lab Results  Component Value Date   TSH 6.560 (H) 07/07/2021   Lab Results  Component Value Date   HGBA1C 6.5 (H) 10/29/2022   Lab Results  Component Value Date   WBC 6.1 07/07/2021   HGB 11.6 07/07/2021   HCT 36.8 07/07/2021   MCV 83 07/07/2021   PLT 275 07/07/2021   Lab Results  Component Value Date   ALT 82 (H) 07/07/2021   AST 62 (H) 07/07/2021   ALKPHOS 51 07/07/2021   BILITOT 0.4 07/07/2021   No results found for: MARIEN BOLLS, VD25OH   Patient Active Problem List   Diagnosis Date Noted   Muscle cramps at night 10/29/2022   Prediabetes 11/10/2021   Mixed hyperlipidemia 06/30/2021   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  No outpatient medications have been marked as taking for the 01/08/23 encounter (Office Visit) with Justus Leita DEL, MD.  01/08/2023    8:05 AM 10/29/2022    4:15 PM 11/10/2021    9:26 AM 06/30/2021   10:34 AM  GAD 7 : Generalized Anxiety Score  Nervous, Anxious, on Edge 0 0 0 0  Control/stop worrying 0 0 0 0  Worry too much - different things 0 0 0 0  Trouble relaxing 0 0 0 0  Restless 0 0 0 0  Easily annoyed or irritable 0 0 0 0  Afraid - awful might happen 0 0 0 0  Total GAD 7 Score 0 0 0 0  Anxiety Difficulty Not difficult at all Not difficult at all Not difficult at all Not difficult at all       01/08/2023    8:05 AM 10/29/2022    4:15 PM 11/10/2021    9:26 AM  Depression screen PHQ  2/9  Decreased Interest 0 0 0  Down, Depressed, Hopeless 0 0 0  PHQ - 2 Score 0 0 0  Altered sleeping 0 0 0  Tired, decreased energy 0 0 0  Change in appetite 0 0 0  Feeling bad or failure about yourself  0 0 0  Trouble concentrating 0 0 0  Moving slowly or fidgety/restless 0 0 0  Suicidal thoughts 0 0 0  PHQ-9 Score 0 0 0  Difficult doing work/chores Not difficult at all Not difficult at all Not difficult at all    BP Readings from Last 3 Encounters:  01/08/23 117/72  10/29/22 128/78  01/23/22 124/80    Physical Exam Vitals and nursing note reviewed.  Constitutional:      General: She is not in acute distress.    Appearance: She is well-developed.  HENT:     Head: Normocephalic and atraumatic.     Right Ear: Tympanic membrane and ear canal normal.     Left Ear: Tympanic membrane and ear canal normal.     Nose:     Right Sinus: No maxillary sinus tenderness.     Left Sinus: No maxillary sinus tenderness.  Eyes:     General: No scleral icterus.       Right eye: No discharge.        Left eye: No discharge.     Conjunctiva/sclera: Conjunctivae normal.  Neck:     Thyroid: No thyromegaly.     Vascular: No carotid bruit.  Cardiovascular:     Rate and Rhythm: Normal rate and regular rhythm.     Pulses: Normal pulses.     Heart sounds: Normal heart sounds.  Pulmonary:     Effort: Pulmonary effort is normal. No respiratory distress.     Breath sounds: No wheezing.  Chest:  Breasts:    Right: No mass, nipple discharge, skin change or tenderness.     Left: No mass, nipple discharge, skin change or tenderness.  Abdominal:     General: Bowel sounds are normal.     Palpations: Abdomen is soft.     Tenderness: There is no abdominal tenderness.  Musculoskeletal:     Cervical back: Normal range of motion. No erythema.     Right lower leg: No edema.     Left lower leg: No edema.  Lymphadenopathy:     Cervical: No cervical adenopathy.  Skin:    General: Skin is warm and  dry.     Capillary Refill: Capillary refill takes less than 2 seconds.     Findings: No rash.  Neurological:     General: No focal deficit present.  Mental Status: She is alert and oriented to person, place, and time.     Cranial Nerves: No cranial nerve deficit.     Sensory: No sensory deficit.     Deep Tendon Reflexes: Reflexes are normal and symmetric.  Psychiatric:        Attention and Perception: Attention normal.        Mood and Affect: Mood normal.     Wt Readings from Last 3 Encounters:  01/08/23 196 lb 6.4 oz (89.1 kg)  10/29/22 195 lb (88.5 kg)  01/23/22 196 lb (88.9 kg)    BP 117/72   Pulse 98   Ht 5' 2 (1.575 m)   Wt 196 lb 6.4 oz (89.1 kg)   SpO2 100%   BMI 35.92 kg/m   Assessment and Plan:  Problem List Items Addressed This Visit       Unprioritized   Mixed hyperlipidemia (Chronic)   LDL is  Lab Results  Component Value Date   LDLCALC 120 (H) 07/07/2021    Current regimen is diet alone.        Relevant Orders   Lipid panel   Prediabetes (Chronic)   Blood sugars stable without hypoglycemic symptoms or events. Currently managed with diet. Changes made last visit are none. Diet and weight loss re-enforced. Lab Results  Component Value Date   HGBA1C 6.5 (H) 10/29/2022         Relevant Orders   Comprehensive metabolic panel   Hemoglobin A1c   Other Visit Diagnoses       Annual physical exam    -  Primary   Urine pregnancy negative continue OCPs daily - may be perimenopausal   Relevant Orders   CBC with Differential/Platelet   Comprehensive metabolic panel   Hemoglobin A1c   Lipid panel   POCT urine pregnancy (Completed)     Encounter for screening mammogram for breast cancer       Due in July   Relevant Orders   MM 3D SCREENING MAMMOGRAM BILATERAL BREAST     Colon cancer screening       discussed Cologuard vs Colonoscopy   Relevant Orders   Ambulatory referral to Gastroenterology     Elevated TSH       Relevant Orders    TSH + free T4     Elevated liver enzymes       Relevant Orders   Comprehensive metabolic panel       No follow-ups on file.    Leita HILARIO Adie, MD [facility] [place]

## 2023-01-08 NOTE — Assessment & Plan Note (Signed)
 LDL is  Lab Results  Component Value Date   LDLCALC 120 (H) 07/07/2021    Current regimen is diet alone.

## 2023-01-08 NOTE — Patient Instructions (Signed)
 Call [facility] to schedule your mammogram at [phone].

## 2023-01-09 LAB — CBC WITH DIFFERENTIAL/PLATELET
Basophils Absolute: 0 10*3/uL (ref 0.0–0.2)
Basos: 1 %
EOS (ABSOLUTE): 0.4 10*3/uL (ref 0.0–0.4)
Eos: 6 %
Hematocrit: 40.4 % (ref 34.0–46.6)
Hemoglobin: 13.3 g/dL (ref 11.1–15.9)
Immature Grans (Abs): 0 10*3/uL (ref 0.0–0.1)
Immature Granulocytes: 0 %
Lymphocytes Absolute: 2.3 10*3/uL (ref 0.7–3.1)
Lymphs: 39 %
MCH: 29.5 pg (ref 26.6–33.0)
MCHC: 32.9 g/dL (ref 31.5–35.7)
MCV: 90 fL (ref 79–97)
Monocytes Absolute: 0.4 10*3/uL (ref 0.1–0.9)
Monocytes: 7 %
Neutrophils Absolute: 2.7 10*3/uL (ref 1.4–7.0)
Neutrophils: 47 %
Platelets: 245 10*3/uL (ref 150–450)
RBC: 4.51 x10E6/uL (ref 3.77–5.28)
RDW: 14.1 % (ref 11.7–15.4)
WBC: 5.8 10*3/uL (ref 3.4–10.8)

## 2023-01-09 LAB — COMPREHENSIVE METABOLIC PANEL
ALT: 47 [IU]/L — ABNORMAL HIGH (ref 0–32)
AST: 41 [IU]/L — ABNORMAL HIGH (ref 0–40)
Albumin: 4.2 g/dL (ref 3.9–4.9)
Alkaline Phosphatase: 45 [IU]/L (ref 44–121)
BUN/Creatinine Ratio: 20 (ref 9–23)
BUN: 13 mg/dL (ref 6–24)
Bilirubin Total: 0.3 mg/dL (ref 0.0–1.2)
CO2: 20 mmol/L (ref 20–29)
Calcium: 9 mg/dL (ref 8.7–10.2)
Chloride: 104 mmol/L (ref 96–106)
Creatinine, Ser: 0.66 mg/dL (ref 0.57–1.00)
Globulin, Total: 2.6 g/dL (ref 1.5–4.5)
Glucose: 138 mg/dL — ABNORMAL HIGH (ref 70–99)
Potassium: 4.3 mmol/L (ref 3.5–5.2)
Sodium: 138 mmol/L (ref 134–144)
Total Protein: 6.8 g/dL (ref 6.0–8.5)
eGFR: 110 mL/min/{1.73_m2} (ref >59)

## 2023-01-09 LAB — LIPID PANEL
Chol/HDL Ratio: 5.7 {ratio} — ABNORMAL HIGH (ref 0.0–4.4)
Cholesterol, Total: 227 mg/dL — ABNORMAL HIGH (ref 100–199)
HDL: 40 mg/dL (ref >39)
LDL Chol Calc (NIH): 152 mg/dL — ABNORMAL HIGH (ref 0–99)
Triglycerides: 193 mg/dL — ABNORMAL HIGH (ref 0–149)
VLDL Cholesterol Cal: 35 mg/dL (ref 5–40)

## 2023-01-09 LAB — TSH+FREE T4
Free T4: 0.87 ng/dL (ref 0.82–1.77)
TSH: 8.77 u[IU]/mL — ABNORMAL HIGH (ref 0.450–4.500)

## 2023-01-09 LAB — HEMOGLOBIN A1C
Est. average glucose Bld gHb Est-mCnc: 143 mg/dL
Hgb A1c MFr Bld: 6.6 % — ABNORMAL HIGH (ref 4.8–5.6)

## 2023-01-10 ENCOUNTER — Telehealth: Payer: Self-pay

## 2023-01-10 NOTE — Telephone Encounter (Signed)
 Attempted to contact patient to schedule her colonoscopy. Call was dropped while triaging.  Call made with the assistance of [facility] Interpreters ID [id number] [facility].  She attempted to call patient back but no answer.  Voice message was left.  Thanks, [place], CMA

## 2023-01-28 ENCOUNTER — Encounter: Payer: Self-pay | Admitting: *Deleted

## 2023-02-15 ENCOUNTER — Other Ambulatory Visit: Payer: Self-pay | Admitting: Internal Medicine

## 2023-02-15 DIAGNOSIS — Z30011 Encounter for initial prescription of contraceptive pills: Secondary | ICD-10-CM

## 2023-02-15 NOTE — Telephone Encounter (Signed)
 Medication Refill -  Most Recent Primary Care Visit:  Provider: BERGLUND, LAURA H  Department: [facility] [place]  Visit Type: PHYSICAL  Date: 01/08/2023  Medication: Norethindrone Acetate-Ethinyl Estrad-FE (MICROGESTIN  24 FE) 1-20 MG-MCG(24) tablet   Has the patient contacted their pharmacy? Yes   Is this the correct pharmacy for this prescription? Yes .  This is the patient's preferred pharmacy:  [id number] - [place], [place] - [address] [place] [address] Phone: [phone] Fax: [phone]   Has the prescription been filled recently? Yes  Is the patient out of the medication? Yes  Has the patient been seen for an appointment in the last year OR does the patient have an upcoming appointment? No  Can we respond through MyChart? No  Agent: Please be advised that Rx refills may take up to 3 business days. We ask that you follow-up with your pharmacy.

## 2023-02-18 NOTE — Telephone Encounter (Signed)
 Requested medication (s) are due for refill today - yes  Requested medication (s) are on the active medication list -yes  Future visit scheduled -no  Last refill: 01/23/22 #[id number]  Notes to clinic: outside provider- sent for review   Requested Prescriptions  Pending Prescriptions Disp Refills   Norethindrone Acetate-Ethinyl Estrad-FE (MICROGESTIN  24 FE) 1-20 MG-MCG(24) tablet 84 tablet 3    Sig: Take 1 tablet by mouth daily.     OB/GYN:  Contraceptives Passed - 02/18/2023 10:35 AM      Passed - Last BP in normal range    BP Readings from Last 1 Encounters:  01/08/23 117/72         Passed - Valid encounter within last 12 months    Recent Outpatient Visits           1 month ago Annual physical exam   [place] [facility] at [facility], Chales [place], MD   3 months ago Prediabetes   [facility] at [facility], Chales [place], MD   1 year ago Prediabetes   [facility] at [facility], Chales [place], MD   1 year ago Annual physical exam   [facility] at [facility], Chales [place], MD   2 years ago Urticaria of entire body   [place] [facility] at [facility], Dessie Flow, MD              Passed - Patient is not a smoker         Requested Prescriptions  Pending Prescriptions Disp Refills   Norethindrone Acetate-Ethinyl Estrad-FE (MICROGESTIN  24 FE) 1-20 MG-MCG(24) tablet 84 tablet 3    Sig: Take 1 tablet by mouth daily.     OB/GYN:  Contraceptives Passed - 02/18/2023 10:35 AM      Passed - Last BP in normal range    BP Readings from Last 1 Encounters:  01/08/23 117/72         Passed - Valid encounter within last 12 months    Recent Outpatient Visits           1 month ago Annual physical exam   [place] [facility] at [facility], Chales [place], MD   3  months ago Prediabetes   [facility] at [facility], Chales [place], MD   1 year ago Prediabetes   [facility] at [facility], Chales [place], MD   1 year ago Annual physical exam   [facility] at [facility], Chales [place], MD   2 years ago Urticaria of entire body   [place] [facility] at [facility], Dessie Flow, MD              Passed - Patient is not a smoker

## 2023-03-06 NOTE — Telephone Encounter (Signed)
 Patient requesting refill on Birth Control.  Copied from CRM [id number]. Topic: Clinical - Prescription Issue >> Mar 06, 2023 10:11 AM Michelle Camacho wrote: Reason for CRM: Patient is still waiting on her medication to be refilled and would like to be called to know what the hold up is. Please advise patient ASAP.

## 2023-03-12 ENCOUNTER — Other Ambulatory Visit: Payer: Self-pay | Admitting: Internal Medicine

## 2023-03-12 ENCOUNTER — Telehealth: Payer: Self-pay

## 2023-03-12 DIAGNOSIS — Z30011 Encounter for initial prescription of contraceptive pills: Secondary | ICD-10-CM

## 2023-03-12 NOTE — Telephone Encounter (Signed)
 Copied from CRM [id number]. Topic: Clinical - Medication Refill >> Mar 12, 2023  3:41 PM Gery Pray wrote: Most Recent Primary Care Visit:  Provider: Reubin Milan  Department: [facility] [place]  Visit Type: PHYSICAL  Date: 01/08/2023  Interpreter Jose [id number]  Medication: Norethindrone Acetate-Ethinyl Estrad-FE (MICROGESTIN 24 FE) 1-20 MG-MCG(24) tablet  Has the patient contacted their pharmacy? Yes (Agent: If no, request that the patient contact the pharmacy for the refill. If patient does not wish to contact the pharmacy document the reason why and proceed with request.) (Agent: If yes, when and what did the pharmacy advise?)  Is this the correct pharmacy for this prescription? Yes If no, delete pharmacy and type the correct one.  This is the patient's preferred pharmacy:  [id number] - [place], [place] - [address] [place] [address] Phone: [phone] Fax: [phone]   Has the prescription been filled recently? No  Is the patient out of the medication? Yes  Has the patient been seen for an appointment in the last year OR does the patient have an upcoming appointment? Yes  Can we respond through MyChart? No  Agent: Please be advised that Rx refills may take up to 3 business days. We ask that you follow-up with your pharmacy.

## 2023-03-12 NOTE — Telephone Encounter (Signed)
 Spoke with patient, verbalized understanding

## 2023-03-12 NOTE — Telephone Encounter (Signed)
 Spoke with patient, informed her that the medication is supposed to be filled by her Gynecologist. Dr Judithann Graves does not prescribe this for her. Patient verbalized understanding.      Copied from CRM [id number]. Topic: Clinical - Medication Refill >> Mar 12, 2023  3:41 PM Gery Pray wrote: Most Recent Primary Care Visit:  Provider: Reubin Milan  Department: [facility] [place]  Visit Type: PHYSICAL  Date: 01/08/2023  Interpreter Jose [id number]  Medication: Norethindrone Acetate-Ethinyl Estrad-FE (MICROGESTIN 24 FE) 1-20 MG-MCG(24) tablet  Has the patient contacted their pharmacy? Yes (Agent: If no, request that the patient contact the pharmacy for the refill. If patient does not wish to contact the pharmacy document the reason why and proceed with request.) (Agent: If yes, when and what did the pharmacy advise?)  Is this the correct pharmacy for this prescription? Yes If no, delete pharmacy and type the correct one.  This is the patient's preferred pharmacy:  [id number] - [place], [place] - [address] [place] [address] Phone: [phone] Fax: [phone]   Has the prescription been filled recently? No  Is the patient out of the medication? Yes  Has the patient been seen for an appointment in the last year OR does the patient have an upcoming appointment? Yes  Can we respond through MyChart? No  Agent: Please be advised that Rx refills may take up to 3 business days. We ask that you follow-up with your pharmacy.

## 2023-03-13 ENCOUNTER — Other Ambulatory Visit: Payer: Self-pay | Admitting: Obstetrics and Gynecology

## 2023-03-13 ENCOUNTER — Telehealth: Payer: Self-pay

## 2023-03-13 DIAGNOSIS — Z30011 Encounter for initial prescription of contraceptive pills: Secondary | ICD-10-CM

## 2023-03-13 MED ORDER — MICROGESTIN 24 FE 1-20 MG-MCG PO TABS: 1.0000 | ORAL_TABLET | Freq: Every day | ORAL | 0 refills | Status: DC

## 2023-03-13 NOTE — Telephone Encounter (Signed)
 Patient calling triage asking for [facility] RF. ABC's last note says if patient does ok with them her PCP can RF. BC helped her irregular bleeding. She reached out to her PCP yesterday and was advised to reach out to her GYN.

## 2023-03-13 NOTE — Progress Notes (Unsigned)
 Rx OCP RF. Will see if PCP will manage since pt doesn't have to see me, unless she wants to come for annuals.

## 2023-03-13 NOTE — Telephone Encounter (Signed)
 Rx RF eRxd. For 3 months. I'm checking with her PCP if they will Rx going forward.

## 2023-03-14 NOTE — Telephone Encounter (Signed)
 Pt aware.

## 2023-03-18 NOTE — Telephone Encounter (Signed)
 Disucssed via secure chat with PCP that if she can do annuals, then pt doesn't need to be seen here and she can manage pt. She understands.

## 2023-04-26 ENCOUNTER — Ambulatory Visit: Admitting: Internal Medicine

## 2023-04-26 ENCOUNTER — Encounter: Payer: Self-pay | Admitting: Internal Medicine

## 2023-04-26 VITALS — BP 130/80 | HR 83 | Ht 62.0 in | Wt 192.0 lb

## 2023-04-26 DIAGNOSIS — E782 Mixed hyperlipidemia: Secondary | ICD-10-CM

## 2023-04-26 DIAGNOSIS — R7303 Prediabetes: Secondary | ICD-10-CM | POA: Diagnosis not present

## 2023-04-26 DIAGNOSIS — Z1211 Encounter for screening for malignant neoplasm of colon: Secondary | ICD-10-CM

## 2023-04-26 DIAGNOSIS — Z3041 Encounter for surveillance of contraceptive pills: Secondary | ICD-10-CM

## 2023-04-26 LAB — POCT GLYCOSYLATED HEMOGLOBIN (HGB A1C): Hemoglobin A1C: 5.9 % — AB (ref 4.0–5.6)

## 2023-04-26 MED ORDER — MICROGESTIN 24 FE 1-20 MG-MCG PO TABS: 1.0000 | ORAL_TABLET | Freq: Every day | ORAL | 3 refills | Status: AC

## 2023-04-26 NOTE — Assessment & Plan Note (Signed)
 Doing well on OCPs Pap and Pelvic up to date Will refill medications.

## 2023-04-26 NOTE — Assessment & Plan Note (Addendum)
 BS elevated last visit - encouraged diet and weight loss.  She had made some changes and lost 4 lbs. Lab Results  Component Value Date   HGBA1C 6.6 (H) 01/08/2023  A1C today = 5.9 Excellent work - continue diet changes and recheck with labs in 6 months.

## 2023-04-26 NOTE — Assessment & Plan Note (Signed)
 Borderline elevated lipids with mild increase in LFTs Should improve with ongoing diet changes and weight loss Will recheck labs at next visit - RUQ US  if not improved.

## 2023-04-26 NOTE — Progress Notes (Signed)
 Date:  04/26/2023   Name:  Michelle Camacho   DOB:  [birth year]   MRN:  [id number]   Chief Complaint: Prediabetes  Diabetes She presents for her follow-up diabetic visit. Diabetes type: prediabetes. Her disease course has been improving. Pertinent negatives for hypoglycemia include no dizziness, headaches or nervousness/anxiousness. Pertinent negatives for diabetes include no blurred vision, no chest pain, no fatigue and no weakness. Symptoms are improving.  Hyperlipidemia This is a chronic problem. Recent lipid tests were reviewed and are high. Exacerbating diseases include liver disease (fatty liver). Pertinent negatives include no chest pain or shortness of breath. Current antihyperlipidemic treatment includes diet change and exercise.  Contraception - she is doing well on OCPs, menses are regular without prolonged bleeding or cramping.  Pap was normal in 2022.  She has no concerns but would like refills.  Review of Systems  Constitutional:  Positive for unexpected weight change (has lost 4 lbs with effort). Negative for chills and fatigue.  Eyes:  Negative for blurred vision.  Respiratory:  Negative for chest tightness and shortness of breath.   Cardiovascular:  Negative for chest pain and palpitations.  Genitourinary:  Negative for menstrual problem.  Neurological:  Negative for dizziness, weakness and headaches.  Psychiatric/Behavioral:  Negative for dysphoric mood and sleep disturbance. The patient is not nervous/anxious.      Lab Results  Component Value Date   NA 138 01/08/2023   K 4.3 01/08/2023   CO2 20 01/08/2023   GLUCOSE 138 (H) 01/08/2023   BUN 13 01/08/2023   CREATININE 0.66 01/08/2023   CALCIUM 9.0 01/08/2023   EGFR 110 01/08/2023   Lab Results  Component Value Date   CHOL 227 (H) 01/08/2023   HDL 40 01/08/2023   LDLCALC 152 (H) 01/08/2023   TRIG 193 (H) 01/08/2023   CHOLHDL 5.7 (H) 01/08/2023   Lab Results  Component Value Date   TSH 8.770  (H) 01/08/2023   Lab Results  Component Value Date   HGBA1C 5.9 (A) 04/26/2023   Lab Results  Component Value Date   WBC 5.8 01/08/2023   HGB 13.3 01/08/2023   HCT 40.4 01/08/2023   MCV 90 01/08/2023   PLT 245 01/08/2023   Lab Results  Component Value Date   ALT 47 (H) 01/08/2023   AST 41 (H) 01/08/2023   ALKPHOS 45 01/08/2023   BILITOT 0.3 01/08/2023   No results found for: "25OHVITD2", "25OHVITD3", "VD25OH"   Patient Active Problem List   Diagnosis Date Noted   Encounter for surveillance of contraceptive pills 04/26/2023   Muscle cramps at night 10/29/2022   Prediabetes 11/10/2021   Mixed hyperlipidemia 06/30/2021   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  Current Meds  Medication Sig   [DISCONTINUED] Norethindrone Acetate-Ethinyl Estrad-FE (MICROGESTIN  24 FE) 1-20 MG-MCG(24) tablet Take 1 tablet by mouth daily.       04/26/2023   11:01 AM 01/08/2023    8:05 AM 10/29/2022    4:15 PM 11/10/2021    9:26 AM  GAD  7 : Generalized Anxiety Score  Nervous, Anxious, on Edge 0 0 0 0  Control/stop worrying 0 0 0 0  Worry too much - different things 0 0 0 0  Trouble relaxing 0 0 0 0  Restless 0 0 0 0  Easily annoyed or irritable 0 0 0 0  Afraid - awful might happen 0 0 0 0  Total GAD 7 Score 0 0 0 0  Anxiety Difficulty Not difficult at all Not difficult at all Not difficult at all Not difficult at all       04/26/2023   11:01 AM 01/08/2023    8:05 AM 10/29/2022    4:15 PM  Depression screen PHQ 2/9  Decreased Interest 0 0 0  Down, Depressed, Hopeless 0 0 0  PHQ -  2 Score 0 0 0  Altered sleeping 0 0 0  Tired, decreased energy 0 0 0  Change in appetite 0 0 0  Feeling bad or failure about yourself  0 0 0  Trouble concentrating 0 0 0  Moving slowly or fidgety/restless 0 0 0  Suicidal thoughts 0 0 0  PHQ-9 Score 0 0 0  Difficult doing work/chores Not difficult at all Not difficult at all Not difficult at all    BP Readings from Last 3 Encounters:  04/26/23 130/80  01/08/23 117/72  10/29/22 128/78    Physical Exam Constitutional:      Appearance: Normal appearance.  Cardiovascular:     Rate and Rhythm: Normal rate and regular rhythm.     Heart sounds: No murmur heard. Pulmonary:     Effort: Pulmonary effort is normal.     Breath sounds: No wheezing or rhonchi.  Musculoskeletal:     Cervical back: Normal range of motion.     Right lower leg: No edema.     Left lower leg: No edema.  Lymphadenopathy:     Cervical: No cervical adenopathy.  Neurological:     General: No focal deficit present.     Mental Status: She is alert.     Wt Readings from Last 3 Encounters:  04/26/23 192 lb (87.1 kg)  01/08/23 196 lb 6.4 oz (89.1 kg)  10/29/22 195 lb (88.5 kg)    BP 130/80   Pulse 83   Ht 5\' 2"  (1.575 m)   Wt 192 lb (87.1 kg)   LMP 04/05/2023 (Approximate)   SpO2 96%   BMI 35.12 kg/m   Assessment and Plan:  Problem List Items Addressed This Visit       Unprioritized   Mixed hyperlipidemia (Chronic)   Borderline elevated lipids with mild increase in LFTs Should improve with ongoing diet changes and weight loss Will recheck labs at next visit - RUQ US  if not improved.      Prediabetes - Primary (Chronic)   BS elevated last visit - encouraged diet and weight loss.  She had made some changes and lost 4 lbs. Lab Results  Component Value Date   HGBA1C 6.6 (H) 01/08/2023  A1C today = 5.9 Excellent work - continue diet changes and recheck with labs in 6 months.      Relevant Orders   POCT glycosylated hemoglobin (Hb A1C)  (Completed)   Encounter for surveillance of contraceptive pills   Doing well on OCPs Pap and Pelvic up to date Will refill medications.      Relevant Medications   Norethindrone Acetate-Ethinyl Estrad-FE (MICROGESTIN  24 FE) 1-20 MG-MCG(24) tablet   Other Visit Diagnoses  Colon cancer screening       Referred but could not schedule on the phone due to lack of Spanish interpretor. I will message [facility] and ask them to reach out to her again.       Return in about 6 months (around 10/26/2023) for preDM, liver, cholesterol.    Sheron Dixons, MD [facility] [place]

## 2023-05-21 ENCOUNTER — Ambulatory Visit: Payer: No Typology Code available for payment source | Admitting: Internal Medicine

## 2023-09-27 ENCOUNTER — Ambulatory Visit
Admission: RE | Admit: 2023-09-27 | Discharge: 2023-09-27 | Disposition: A | Discharge status: Home / Self Care | Source: Ambulatory Visit | Attending: Internal Medicine | Admitting: Internal Medicine

## 2023-09-27 DIAGNOSIS — Z1231 Encounter for screening mammogram for malignant neoplasm of breast: Secondary | ICD-10-CM | POA: Insufficient documentation

## 2023-10-10 ENCOUNTER — Ambulatory Visit: Admitting: Internal Medicine

## 2023-12-31 ENCOUNTER — Encounter: Payer: Self-pay | Admitting: Internal Medicine

## 2023-12-31 ENCOUNTER — Ambulatory Visit: Admitting: Internal Medicine

## 2023-12-31 VITALS — BP 106/68 | HR 86 | Ht 62.0 in | Wt 189.0 lb

## 2023-12-31 DIAGNOSIS — M238X2 Other internal derangements of left knee: Secondary | ICD-10-CM | POA: Diagnosis not present

## 2023-12-31 DIAGNOSIS — E782 Mixed hyperlipidemia: Secondary | ICD-10-CM

## 2023-12-31 DIAGNOSIS — R7303 Prediabetes: Secondary | ICD-10-CM

## 2023-12-31 DIAGNOSIS — M238X1 Other internal derangements of right knee: Secondary | ICD-10-CM

## 2023-12-31 DIAGNOSIS — R748 Abnormal levels of other serum enzymes: Secondary | ICD-10-CM | POA: Insufficient documentation

## 2023-12-31 DIAGNOSIS — Z23 Encounter for immunization: Secondary | ICD-10-CM | POA: Diagnosis not present

## 2023-12-31 NOTE — Assessment & Plan Note (Signed)
 Noted last visit Hopefully improved with diet and weight loss

## 2023-12-31 NOTE — Assessment & Plan Note (Signed)
 She has cut out soda and tortillas She has lost about 9 lbs. Will recheck A1c and advise

## 2023-12-31 NOTE — Patient Instructions (Addendum)
 Joint supplement - Osteo Bioflex or similar for knees.

## 2023-12-31 NOTE — Progress Notes (Signed)
 "   Date:  12/31/2023   Name:  Michelle Camacho   DOB:  [birth year]   MRN:  [id number]   Chief Complaint: Prediabetes  Hyperlipidemia This is a chronic problem. Recent lipid tests were reviewed and are high. Pertinent negatives include no chest pain or shortness of breath. Current antihyperlipidemic treatment includes diet change.  Diabetes She presents for her follow-up diabetic visit. Diabetes type: prediabetes. Pertinent negatives for hypoglycemia include no dizziness, headaches or nervousness/anxiousness. Pertinent negatives for diabetes include no chest pain and no fatigue. Current diabetic treatment includes diet.    Review of Systems  Constitutional:  Negative for chills, fatigue and fever.  Respiratory:  Negative for chest tightness and shortness of breath.   Cardiovascular:  Negative for chest pain and palpitations.  Genitourinary:  Negative for dysuria.  Musculoskeletal:  Positive for arthralgias (knee click with movement - no pain).  Neurological:  Negative for dizziness and headaches.  Psychiatric/Behavioral:  Negative for dysphoric mood and sleep disturbance. The patient is not nervous/anxious.      Lab Results  Component Value Date   NA 138 01/08/2023   K 4.3 01/08/2023   CO2 20 01/08/2023   GLUCOSE 138 (H) 01/08/2023   BUN 13 01/08/2023   CREATININE 0.66 01/08/2023   CALCIUM 9.0 01/08/2023   EGFR 110 01/08/2023   Lab Results  Component Value Date   CHOL 227 (H) 01/08/2023   HDL 40 01/08/2023   LDLCALC 152 (H) 01/08/2023   TRIG 193 (H) 01/08/2023   CHOLHDL 5.7 (H) 01/08/2023   Lab Results  Component Value Date   TSH 8.770 (H) 01/08/2023   Lab Results  Component Value Date   HGBA1C 5.9 (A) 04/26/2023   Lab Results  Component Value Date   WBC 5.8 01/08/2023   HGB 13.3 01/08/2023   HCT 40.4 01/08/2023   MCV 90 01/08/2023   PLT 245 01/08/2023   Lab Results  Component Value Date   ALT 47 (H) 01/08/2023   AST 41 (H) 01/08/2023   ALKPHOS  45 01/08/2023   BILITOT 0.3 01/08/2023   No results found for: MARIEN BOLLS, VD25OH   Patient Active Problem List   Diagnosis Date Noted   Elevated liver enzymes 12/31/2023   Crepitus of both knee joints 12/31/2023   Encounter for surveillance of contraceptive pills 04/26/2023   Muscle cramps at night 10/29/2022   Prediabetes 11/10/2021   Mixed hyperlipidemia 06/30/2021   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  Active Medications[3]     12/31/2023   10:00 AM 04/26/2023   11:01 AM 01/08/2023    8:05 AM 10/29/2022    4:15 PM  GAD 7 : Generalized Anxiety Score  Nervous, Anxious, on Edge 0 0 0 0  Control/stop worrying 0 0 0 0  Worry too much - different things 0 0 0 0  Trouble relaxing 0 0 0 0  Restless 0 0 0 0  Easily annoyed or irritable 0 0 0 0  Afraid - awful might happen 0 0 0 0  Total GAD 7 Score 0 0 0 0  Anxiety Difficulty Not difficult at all Not difficult at all Not difficult at all Not difficult at all       12/31/2023   10:00 AM 04/26/2023  11:01 AM 01/08/2023    8:05 AM  Depression screen PHQ 2/9  Decreased Interest 0 0 0  Down, Depressed, Hopeless 0 0 0  PHQ - 2 Score 0 0 0  Altered sleeping 0 0 0  Tired, decreased energy 0 0 0  Change in appetite 0 0 0  Feeling bad or failure about yourself  0 0 0  Trouble concentrating 0 0 0  Moving slowly or fidgety/restless 0 0 0  Suicidal thoughts 0 0 0  PHQ-9 Score 0 0  0   Difficult doing work/chores Not difficult at all Not difficult at all Not difficult at all     Data saved with a previous flowsheet row definition    BP Readings from Last 3 Encounters:  12/31/23 106/68  04/26/23 130/80  01/08/23 117/72    Physical Exam Vitals and nursing note reviewed.   Constitutional:      General: She is not in acute distress.    Appearance: Normal appearance. She is well-developed.  HENT:     Head: Normocephalic and atraumatic.  Neck:     Vascular: No carotid bruit.  Cardiovascular:     Rate and Rhythm: Normal rate and regular rhythm.  Pulmonary:     Effort: Pulmonary effort is normal. No respiratory distress.     Breath sounds: No wheezing or rhonchi.  Musculoskeletal:     Cervical back: Normal range of motion.     Right knee: Crepitus present. No swelling, deformity or effusion. No tenderness.     Left knee: Crepitus present. No swelling, deformity or effusion. No tenderness.     Right lower leg: No edema.     Left lower leg: No edema.  Lymphadenopathy:     Cervical: No cervical adenopathy.  Skin:    General: Skin is warm and dry.     Findings: No rash.  Neurological:     Mental Status: She is alert and oriented to person, place, and time.  Psychiatric:        Mood and Affect: Mood normal.        Behavior: Behavior normal.     Wt Readings from Last 3 Encounters:  12/31/23 189 lb (85.7 kg)  04/26/23 192 lb (87.1 kg)  01/08/23 196 lb 6.4 oz (89.1 kg)    BP 106/68   Pulse 86   Ht 5' 2 (1.575 m)   Wt 189 lb (85.7 kg)   SpO2 96%   BMI 34.57 kg/m   Assessment and Plan:  Problem List Items Addressed This Visit       Unprioritized   Mixed hyperlipidemia (Chronic)   Will recheck labs and advise. May need to start medications but with high LFTs, may need US  first. Continue to work on diet changes       Relevant Orders   Lipid panel   Prediabetes (Chronic)   She has cut out soda and tortillas She has lost about 9 lbs. Will recheck A1c and advise      Relevant Orders   Hemoglobin A1c   Elevated liver enzymes - Primary   Noted last visit Hopefully improved with diet and weight loss       Relevant Orders   Comprehensive metabolic panel with GFR   Crepitus of both knee joints   No pain - may be early degenerative  changes Can try Osteo-Bioflex supplements      Other Visit Diagnoses       Needs flu shot       Relevant  Orders   Flu vaccine trivalent PF, 6mos and older(Flulaval,Afluria,Fluarix,Fluzone) (Completed)       Return in about 4 months (around 04/30/2024) for CPX.    Leita HILARIO Adie, MD [facility] [place]           [1] No Known Allergies [2]  Social History Tobacco Use   Smoking status: Never   Smokeless tobacco: Never  Vaping Use   Vaping status: Never Used  Substance Use Topics   Alcohol use: Not Currently   Drug use: Never  [3]  Current Meds  Medication Sig   Norethindrone Acetate-Ethinyl Estrad-FE (MICROGESTIN  24 FE) 1-20 MG-MCG(24) tablet Take 1 tablet by mouth daily.   "

## 2023-12-31 NOTE — Assessment & Plan Note (Addendum)
 Will recheck labs and advise. May need to start medications but with high LFTs, may need US  first. Continue to work on diet changes

## 2023-12-31 NOTE — Assessment & Plan Note (Signed)
 No pain - may be early degenerative changes Can try Osteo-Bioflex supplements

## 2024-01-01 LAB — LIPID PANEL
Chol/HDL Ratio: 5.2 ratio — ABNORMAL HIGH (ref 0.0–4.4)
Cholesterol, Total: 194 mg/dL (ref 100–199)
HDL: 37 mg/dL — ABNORMAL LOW
LDL Chol Calc (NIH): 122 mg/dL — ABNORMAL HIGH (ref 0–99)
Triglycerides: 196 mg/dL — ABNORMAL HIGH (ref 0–149)
VLDL Cholesterol Cal: 35 mg/dL (ref 5–40)

## 2024-01-01 LAB — COMPREHENSIVE METABOLIC PANEL WITH GFR
ALT: 22 IU/L (ref 0–32)
AST: 20 IU/L (ref 0–40)
Albumin: 4.2 g/dL (ref 3.9–4.9)
Alkaline Phosphatase: 31 IU/L — ABNORMAL LOW (ref 41–116)
BUN/Creatinine Ratio: 14 (ref 9–23)
BUN: 10 mg/dL (ref 6–24)
Bilirubin Total: 0.3 mg/dL (ref 0.0–1.2)
CO2: 21 mmol/L (ref 20–29)
Calcium: 8.7 mg/dL (ref 8.7–10.2)
Chloride: 103 mmol/L (ref 96–106)
Creatinine, Ser: 0.71 mg/dL (ref 0.57–1.00)
Globulin, Total: 2.5 g/dL (ref 1.5–4.5)
Glucose: 90 mg/dL (ref 70–99)
Potassium: 4.2 mmol/L (ref 3.5–5.2)
Sodium: 138 mmol/L (ref 134–144)
Total Protein: 6.7 g/dL (ref 6.0–8.5)
eGFR: 106 mL/min/1.73

## 2024-01-01 LAB — HEMOGLOBIN A1C
Est. average glucose Bld gHb Est-mCnc: 131 mg/dL
Hgb A1c MFr Bld: 6.2 % — ABNORMAL HIGH (ref 4.8–5.6)

## 2024-01-03 ENCOUNTER — Ambulatory Visit: Payer: Self-pay | Admitting: Internal Medicine

## 2024-04-22 ENCOUNTER — Encounter: Admitting: Student
